# Patient Record
Sex: Female | Born: 1995 | Race: Black or African American | Hispanic: No | Marital: Single | Smoking: Former smoker
Health system: Southern US, Community
[De-identification: ages and names within clinical notes are randomized; demographics above are authoritative.]

## PROBLEM LIST (undated history)

## (undated) DIAGNOSIS — A6009 Herpesviral infection of other urogenital tract: Secondary | ICD-10-CM

## (undated) DIAGNOSIS — J302 Other seasonal allergic rhinitis: Secondary | ICD-10-CM

## (undated) DIAGNOSIS — J45909 Unspecified asthma, uncomplicated: Secondary | ICD-10-CM

## (undated) HISTORY — DX: Unspecified asthma, uncomplicated: J45.909

## (undated) HISTORY — DX: Other seasonal allergic rhinitis: J30.2

## (undated) HISTORY — DX: Herpesviral infection of other urogenital tract: A60.09

## (undated) HISTORY — PX: NO PAST SURGERIES: SHX2092

---
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
## 2014-01-06 ENCOUNTER — Ambulatory Visit (INDEPENDENT_AMBULATORY_CARE_PROVIDER_SITE_OTHER): Payer: 59 | Admitting: Family Medicine

## 2014-01-06 VITALS — BP 102/64 | HR 72 | Temp 98.1°F | Resp 16 | Ht 67.5 in | Wt 117.8 lb

## 2014-01-06 DIAGNOSIS — T7840XA Allergy, unspecified, initial encounter: Secondary | ICD-10-CM

## 2014-01-06 DIAGNOSIS — L500 Allergic urticaria: Secondary | ICD-10-CM

## 2014-01-06 MED ORDER — CETIRIZINE HCL 10 MG PO TABS: 10.0000 mg | ORAL_TABLET | Freq: Every day | ORAL | Status: DC

## 2014-01-06 MED ORDER — RANITIDINE HCL 150 MG PO TABS: 150.0000 mg | ORAL_TABLET | Freq: Two times a day (BID) | ORAL | Status: DC

## 2014-01-06 MED ORDER — HYDROXYZINE HCL 25 MG PO TABS: 12.5000 mg | ORAL_TABLET | Freq: Three times a day (TID) | ORAL | Status: DC | PRN

## 2014-01-06 NOTE — Progress Notes (Signed)
   Subjective:  This chart was scribed for Pamela Sorenson, MD by Bronson Curb, ED Scribe. This patient was seen in room Room/bed info 4 and the patient's care was started at 10:01 AM.   Patient ID: Pamela Sharp, female    DOB: 02-11-1996, 18 y.o.   MRN: [id number]   Chief Complaint  Patient presents with  . Rash    both arms; right leg; today     HPI  HPI Comments: Pamela Sharp is a 18 y.o. female who presents to the [facility] complaining of rash onset today. Patient states she was eating [employer] chicken when she started to break out in a rash over her bilateral arms, right leg, and back. Patient states she is certain it was [employer] since she has washed her sheets yesterday. There is associated bumps, itching, and redness. Patient reports she has "very sensitive" skin, and has used hydrocortisone cream with some improvement. Patient has history of seasonal allergies and takes Claritin daily    Past Medical History  Diagnosis Date  . Seasonal allergies    No current outpatient prescriptions on file prior to visit.   No current facility-administered medications on file prior to visit.   Allergies  Allergen Reactions  . Antihistamines, Chlorpheniramine-Type     Review of Systems  Constitutional: Negative for fever, appetite change and fatigue.  Skin: Positive for rash.      BP 102/64  Pulse 72  Temp(Src) 98.1 F (36.7 C) (Oral)  Resp 16  Ht 5' 7.5" (1.715 m)  Wt 117 lb 12.8 oz (53.434 kg)  BMI 18.17 kg/m2  SpO2 99%  Objective:   Physical Exam  Nursing note and vitals reviewed. Constitutional: She is oriented to person, place, and time. She appears well-developed and well-nourished. No distress.  HENT:  Head: Normocephalic and atraumatic.  Eyes: EOM are normal.  Neck: Neck supple. No tracheal deviation present.  Cardiovascular: Normal rate.   Pulmonary/Chest: Effort normal. No respiratory distress.  Musculoskeletal: Normal range of  motion.  Neurological: She is alert and oriented to person, place, and time.  Skin: Skin is warm and dry. Rash noted.  Numerous erythatmentous papules spread over extensor surface forearm. Some spread over lower back, upper and inner arms and wrists.  Psychiatric: She has a normal mood and affect. Her behavior is normal.          Assessment & Plan:   Allergic reaction  Allergic urticaria  Meds ordered this encounter  Medications  . cetirizine (ZYRTEC) 10 MG tablet    Sig: Take 1 tablet (10 mg total) by mouth daily.    Dispense:  30 tablet    Refill:  1  . hydrOXYzine (ATARAX/VISTARIL) 25 MG tablet    Sig: Take 0.5-1 tablets (12.5-25 mg total) by mouth every 8 (eight) hours as needed for itching.    Dispense:  30 tablet    Refill:  0  . ranitidine (ZANTAC) 150 MG tablet    Sig: Take 1 tablet (150 mg total) by mouth 2 (two) times daily.    Dispense:  60 tablet    Refill:  0    I personally performed the services described in this documentation, which was scribed in my presence. The recorded information has been reviewed and considered, and addended by me as needed.  Pamela Sorenson, MD MPH

## 2014-01-06 NOTE — Patient Instructions (Signed)
Take the cetirizine every night before bed and the ranitidine twice a day for 2 weeks. Use the hydroxyzine as needed for itching.  If the itching continues, apply ice.  Keep lotion or cortisone in the freezer to get soothing relief but we will not use a stronger topical cream due to poss side effects of thinning or lightening the skin. If it gets worse, please come back so we can see if you need a steroid shot.  Hives Hives are itchy, red, swollen areas of the skin. They can vary in size and location on your body. Hives can come and go for hours or several days (acute hives) or for several weeks (chronic hives). Hives do not spread from person to person (noncontagious). They may get worse with scratching, exercise, and emotional stress. CAUSES   Allergic reaction to food, additives, or drugs.  Infections, including the common cold.  Illness, such as vasculitis, lupus, or thyroid disease.  Exposure to sunlight, heat, or cold.  Exercise.  Stress.  Contact with chemicals. SYMPTOMS   Red or white swollen patches on the skin. The patches may change size, shape, and location quickly and repeatedly.  Itching.  Swelling of the hands, feet, and face. This may occur if hives develop deeper in the skin. DIAGNOSIS  Your caregiver can usually tell what is wrong by performing a physical exam. Skin or blood tests may also be done to determine the cause of your hives. In some cases, the cause cannot be determined. TREATMENT  Mild cases usually get better with medicines such as antihistamines. Severe cases may require an emergency epinephrine injection. If the cause of your hives is known, treatment includes avoiding that trigger.  HOME CARE INSTRUCTIONS   Avoid causes that trigger your hives.  Take antihistamines as directed by your caregiver to reduce the severity of your hives. Non-sedating or low-sedating antihistamines are usually recommended. Do not drive while taking an antihistamine.  Take  any other medicines prescribed for itching as directed by your caregiver.  Wear loose-fitting clothing.  Keep all follow-up appointments as directed by your caregiver. SEEK MEDICAL CARE IF:   You have persistent or severe itching that is not relieved with medicine.  You have painful or swollen joints. SEEK IMMEDIATE MEDICAL CARE IF:   You have a fever.  Your tongue or lips are swollen.  You have trouble breathing or swallowing.  You feel tightness in the throat or chest.  You have abdominal pain. These problems may be the first sign of a life-threatening allergic reaction. Call your local emergency services (911 in [place] MAKE SURE YOU:   Understand these instructions.  Will watch your condition.  Will get help right away if you are not doing well or get worse. Document Released: 07/29/2005 Document Revised: 01/28/2012 Document Reviewed: 10/22/2011 [employer] Patient Information 2014 [place], [place].

## 2014-01-30 ENCOUNTER — Ambulatory Visit (INDEPENDENT_AMBULATORY_CARE_PROVIDER_SITE_OTHER): Payer: 59 | Admitting: Emergency Medicine

## 2014-01-30 VITALS — BP 102/60 | HR 76 | Temp 97.8°F | Resp 20 | Ht 67.5 in | Wt 118.0 lb

## 2014-01-30 DIAGNOSIS — M26629 Arthralgia of temporomandibular joint, unspecified side: Secondary | ICD-10-CM

## 2014-01-30 MED ORDER — DIAZEPAM 2 MG PO TABS: 2.0000 mg | ORAL_TABLET | Freq: Four times a day (QID) | ORAL | Status: DC | PRN

## 2014-01-30 MED ORDER — NAPROXEN SODIUM 550 MG PO TABS: 550.0000 mg | ORAL_TABLET | Freq: Two times a day (BID) | ORAL | Status: DC

## 2014-01-30 NOTE — Progress Notes (Signed)
[facility] [address], [place] [id number] [phone]  Date:  01/30/2014   Name:  Pamela Sharp   DOB:  09/30/1995   MRN:  [id number]  PCP:  No PCP Per Patient    Chief Complaint: Jaw Pain   History of Present Illness:  Pamela Sharp is a 18 y.o. very pleasant female patient who presents with the following:  Awoke with pain in right jaw yesterday.  Increases when attempts to open mouth, talk or eat.  No history of injury or cavities.  No cough or coryza, fever or chills, nausea or vomiting.  No stool change or rash.  Some thermal sensitivity to right maxillary and mandibular molars for extended period.  No improvement with over the counter medications or other home remedies. Denies other complaint or health concern today.   There are no active problems to display for this patient.   Past Medical History  Diagnosis Date  . Seasonal allergies     No past surgical history on file.  History  Substance Use Topics  . Smoking status: Former Games developermoker  . Smokeless tobacco: Not on file     Comment: did not smoke daily---social smoking  . Alcohol Use: No    No family history on file.  Allergies  Allergen Reactions  . Antihistamines, Chlorpheniramine-Type     Medication list has been reviewed and updated.  Current Outpatient Prescriptions on File Prior to Visit  Medication Sig Dispense Refill  . hydrOXYzine (ATARAX/VISTARIL) 25 MG tablet Take 0.5-1 tablets (12.5-25 mg total) by mouth every 8 (eight) hours as needed for itching.  30 tablet  0  . ranitidine (ZANTAC) 150 MG tablet Take 1 tablet (150 mg total) by mouth 2 (two) times daily.  60 tablet  0   No current facility-administered medications on file prior to visit.    Review of Systems:  As per HPI, otherwise negative.    Physical Examination: Filed Vitals:   01/30/14 1314  BP: 102/60  Pulse: 76  Temp: 97.8 F (36.6 C)  Resp: 20   Filed Vitals:   01/30/14 1314  Height: 5'  7.5" (1.715 m)  Weight: 118 lb (53.524 kg)   Body mass index is 18.2 kg/(m^2). Ideal Body Weight: Weight in (lb) to have BMI = 25: 161.7   GEN: WDWN, NAD, Non-toxic, Alert & Oriented x 3 HEENT: Atraumatic, Normocephalic.  Tender right TMJ.  Erupting wisdom teeth.  No sialoadenitis Ears and Nose: No external deformity. EXTR: No clubbing/cyanosis/edema NEURO: Normal gait.  PSYCH: Normally interactive. Conversant. Not depressed or anxious appearing.  Calm demeanor.    Assessment and Plan: TMJ Valium Anaprox   Signed,  Phillips OdorJeffery Osinachi Navarrette, MD

## 2014-01-30 NOTE — Patient Instructions (Signed)
Temporomandibular Problems  Temporomandibular joint (TMJ) dysfunction means there are problems with the joint between your jaw and your skull. This is a joint lined by cartilage like other joints in your body but also has a small disc in the joint which keeps the bones from rubbing on each other. These joints are like other joints and can get inflamed (sore) from arthritis and other problems. When this joint gets sore, it can cause headaches and pain in the jaw and the face. CAUSES  Usually the arthritic types of problems are caused by soreness in the joint. Soreness in the joint can also be caused by overuse. This may come from grinding your teeth. It may also come from mis-alignment in the joint. DIAGNOSIS Diagnosis of this condition can often be made by history and exam. Sometimes your caregiver may need X-rays or an MRI scan to determine the exact cause. It may be necessary to see your dentist to determine if your teeth and jaws are lined up correctly. TREATMENT  Most of the time this problem is not serious; however, sometimes it can persist (become chronic). When this happens medications that will cut down on inflammation (soreness) help. Sometimes a shot of cortisone into the joint will be helpful. If your teeth are not aligned it may help for your dentist to make a splint for your mouth that can help this problem. If no physical problems can be found, the problem may come from tension. If tension is found to be the cause, biofeedback or relaxation techniques may be helpful. HOME CARE INSTRUCTIONS   Later in the day, applications of ice packs may be helpful. Ice can be used in a plastic bag with a towel around it to prevent frostbite to skin. This may be used about every 2 hours for 20 to 30 minutes, as needed while awake, or as directed by your caregiver.  Only take over-the-counter or prescription medicines for pain, discomfort, or fever as directed by your caregiver.  If physical therapy was  prescribed, follow your caregiver's directions.  Wear mouth appliances as directed if they were given. Document Released: 04/23/2001 Document Revised: 10/21/2011 Document Reviewed: 07/31/2008 [employer] Patient Information 2015 [employer]. This information is not intended to replace advice given to you by your health care provider. Make sure you discuss any questions you have with your health care provider.  

## 2014-01-31 ENCOUNTER — Telehealth: Payer: Self-pay

## 2014-01-31 NOTE — Telephone Encounter (Signed)
Pt's father Dorene SorrowJerry called in again to see if his daughter can have a different medication instead of Valium.Please call @ [phone].Thank you

## 2014-01-31 NOTE — Telephone Encounter (Signed)
After speaking with Kasandra KnudsenSara H, called patient to check up on her and see what her status is.  LMVM for pt to CB.

## 2014-01-31 NOTE — Telephone Encounter (Signed)
JERRY STATES HIS DAUGHTER WAS GIVEN VALIUM AND HE WOULD LIKE TO HAVE IT CHANGED, HE DOESN'T WANT HER TO GET ADDICTIVE. PLEASE CALL [phone]    [facility] [address]

## 2014-02-01 NOTE — Telephone Encounter (Signed)
Pt called to say meds are too strong,and would like a weaker dose or med  Best phone for pt is [phone]  Pharmacy [facility] [address]

## 2014-02-01 NOTE — Telephone Encounter (Signed)
LMVM to CB. 

## 2014-02-01 NOTE — Telephone Encounter (Signed)
Please let patient know that the valium is the lowest dose possible and is used for a muscle relaxant and should not have any noticeable effects systemically. There is NO chance of her becoming addicted to the short course and low dose she was prescribed.

## 2014-02-01 NOTE — Telephone Encounter (Signed)
Spoke to patients' father and advised that he have the patient call back as she is 18 years old and there is no hippa form on file.  He said he would do so.

## 2014-02-02 ENCOUNTER — Telehealth: Payer: Self-pay

## 2014-02-02 NOTE — Telephone Encounter (Signed)
Patient is inquiring about her rx. Wants to know if we've received the request to change the dosage as she feels it is too strong for her. Please return call and advise. Thank you.

## 2014-02-02 NOTE — Telephone Encounter (Signed)
Spoke to father- Dorene SorrowJerry is on HIPPA. Advised pt Dr. Ewell PoeAnderson's message from previous phone message. Father understands.

## 2014-02-03 NOTE — Telephone Encounter (Signed)
Called pt.  Father answered.  Told him we were returning patient's call.  He said someone had already called them yesterday and that they had worked everything out.

## 2014-04-12 ENCOUNTER — Ambulatory Visit (INDEPENDENT_AMBULATORY_CARE_PROVIDER_SITE_OTHER): Payer: 59 | Admitting: Family Medicine

## 2014-04-12 ENCOUNTER — Ambulatory Visit (INDEPENDENT_AMBULATORY_CARE_PROVIDER_SITE_OTHER): Payer: 59

## 2014-04-12 VITALS — BP 122/70 | HR 75 | Temp 98.5°F | Resp 16 | Ht 67.0 in | Wt 115.0 lb

## 2014-04-12 DIAGNOSIS — T148XXA Other injury of unspecified body region, initial encounter: Secondary | ICD-10-CM

## 2014-04-12 DIAGNOSIS — M79641 Pain in right hand: Secondary | ICD-10-CM

## 2014-04-12 DIAGNOSIS — M79609 Pain in unspecified limb: Secondary | ICD-10-CM

## 2014-04-12 MED ORDER — IBUPROFEN 600 MG PO TABS: 600.0000 mg | ORAL_TABLET | Freq: Three times a day (TID) | ORAL | Status: DC | PRN

## 2014-04-12 MED ORDER — IBUPROFEN 200 MG PO TABS: 500.0000 mg | ORAL_TABLET | Freq: Once | ORAL | Status: DC

## 2014-04-12 NOTE — Patient Instructions (Signed)
Wrist Sprain  with Rehab  A sprain is an injury in which a ligament that maintains the proper alignment of a joint is partially or completely torn. The ligaments of the wrist are susceptible to sprains. Sprains are classified into three categories. Grade 1 sprains cause pain, but the tendon is not lengthened. Grade 2 sprains include a lengthened ligament because the ligament is stretched or partially ruptured. With grade 2 sprains there is still function, although the function may be diminished. Grade 3 sprains are characterized by a complete tear of the tendon or muscle, and function is usually impaired.  SYMPTOMS   · Pain tenderness, inflammation, and/or bruising (contusion) of the injury.  · A "pop" or tear felt and/or heard at the time of injury.  · Decreased wrist function.  CAUSES   A wrist sprain occurs when a force is placed on one or more ligaments that is greater than it/they can withstand. Common mechanisms of injury include:  · Catching a ball with you hands.  · Repetitive and/ or strenuous extension or flexion of the wrist.  RISK INCREASES WITH:  · Previous wrist injury.  · Contact sports (boxing or wrestling).  · Activities in which falling is common.  · Poor strength and flexibility.  · Improperly fitted or padded protective equipment.  PREVENTION  · Warm up and stretch properly before activity.  · Allow for adequate recovery between workouts.  · Maintain physical fitness:  ¨ Strength, flexibility, and endurance.  ¨ Cardiovascular fitness.  · Protect the wrist joint by limiting its motion with the use of taping, braces, or splints.  · Protect the wrist after injury for 6 to 12 months.  PROGNOSIS   The prognosis for wrist sprains depends on the degree of injury. Grade 1 sprains require 2 to 6 weeks of treatment. Grade 2 sprains require 6 to 8 weeks of treatment, and grade 3 sprains require up to 12 weeks.   RELATED COMPLICATIONS   · Prolonged healing time, if improperly treated or  re-injured.  · Recurrent symptoms that result in a chronic problem.  · Injury to nearby structures (bone, cartilage, nerves, or tendons).  · Arthritis of the wrist.  · Inability to compete in athletics at a high level.  · Wrist stiffness or weakness.  · Progression to a complete rupture of the ligament.  TREATMENT   Treatment initially involves resting from any activities that aggravate the symptoms, and the use of ice and medications to help reduce pain and inflammation. Your caregiver may recommend immobilizing the wrist for a period of time in order to reduce stress on the ligament and allow for healing. After immobilization it is important to perform strengthening and stretching exercises to help regain strength and a full range of motion. These exercises may be completed at home or with a therapist. Surgery is not usually required for wrist sprains, unless the ligament has been ruptured (grade 3 sprain).  MEDICATION   · If pain medication is necessary, then nonsteroidal anti-inflammatory medications, such as aspirin and ibuprofen, or other minor pain relievers, such as acetaminophen, are often recommended.  · Do not take pain medication for 7 days before surgery.  · Prescription pain relievers may be given if deemed necessary by your caregiver. Use only as directed and only as much as you need.  HEAT AND COLD  · Cold treatment (icing) relieves pain and reduces inflammation. Cold treatment should be applied for 10 to 15 minutes every 2 to 3 hours for inflammation   and pain and immediately after any activity that aggravates your symptoms. Use ice packs or massage the area with a piece of ice (ice massage).  · Heat treatment may be used prior to performing the stretching and strengthening activities prescribed by your caregiver, physical therapist, or athletic trainer. Use a heat pack or soak your injury in warm water.  SEEK MEDICAL CARE IF:  · Treatment seems to offer no benefit, or the condition worsens.  · Any  medications produce adverse side effects.  EXERCISES  RANGE OF MOTION (ROM) AND STRETCHING EXERCISES - Wrist Sprain   These exercises may help you when beginning to rehabilitate your injury. Your symptoms may resolve with or without further involvement from your physician, physical therapist or athletic trainer. While completing these exercises, remember:   · Restoring tissue flexibility helps normal motion to return to the joints. This allows healthier, less painful movement and activity.  · An effective stretch should be held for at least 30 seconds.  · A stretch should never be painful. You should only feel a gentle lengthening or release in the stretched tissue.  RANGE OF MOTION - Wrist Flexion, Active-Assisted  · Extend your right / left elbow with your fingers pointing down.*  · Gently pull the back of your hand towards you until you feel a gentle stretch on the top of your forearm.  · Hold this position for __________ seconds.  Repeat __________ times. Complete this exercise __________ times per day.   *If directed by your physician, physical therapist or athletic trainer, complete this stretch with your elbow bent rather than extended.  RANGE OF MOTION - Wrist Extension, Active-Assisted  · Extend your right / left elbow and turn your palm upwards.*  · Gently pull your palm/fingertips back so your wrist extends and your fingers point more toward the ground.  · You should feel a gentle stretch on the inside of your forearm.  · Hold this position for __________ seconds.  Repeat __________ times. Complete this exercise __________ times per day.  *If directed by your physician, physical therapist or athletic trainer, complete this stretch with your elbow bent, rather than extended.  RANGE OF MOTION - Supination, Active  · Stand or sit with your elbows at your side. Bend your right / left elbow to 90 degrees.  · Turn your palm upward until you feel a gentle stretch on the inside of your forearm.  · Hold this  position for __________ seconds. Slowly release and return to the starting position.  Repeat __________ times. Complete this stretch __________ times per day.   RANGE OF MOTION - Pronation, Active  · Stand or sit with your elbows at your side. Bend your right / left elbow to 90 degrees.  · Turn your palm downward until you feel a gentle stretch on the top of your forearm.  · Hold this position for __________ seconds. Slowly release and return to the starting position.  Repeat __________ times. Complete this stretch __________ times per day.   STRETCH - Wrist Flexion  · Place the back of your right / left hand on a tabletop leaving your elbow slightly bent. Your fingers should point away from your body.  · Gently press the back of your hand down onto the table by straightening your elbow. You should feel a stretch on the top of your forearm.  · Hold this position for __________ seconds.  Repeat __________ times. Complete this stretch __________ times per day.   STRETCH - Wrist   Extension  · Place your right / left fingertips on a tabletop leaving your elbow slightly bent. Your fingers should point backwards.  · Gently press your fingers and palm down onto the table by straightening your elbow. You should feel a stretch on the inside of your forearm.  · Hold this position for __________ seconds.  Repeat __________ times. Complete this stretch __________ times per day.   STRENGTHENING EXERCISES - Wrist Sprain  These exercises may help you when beginning to rehabilitate your injury. They may resolve your symptoms with or without further involvement from your physician, physical therapist or athletic trainer. While completing these exercises, remember:   · Muscles can gain both the endurance and the strength needed for everyday activities through controlled exercises.  · Complete these exercises as instructed by your physician, physical therapist or athletic trainer. Progress with the resistance and repetition exercises  only as your caregiver advises.  STRENGTH - Wrist Flexors  · Sit with your right / left forearm palm-up and fully supported. Your elbow should be resting below the height of your shoulder. Allow your wrist to extend over the edge of the surface.  · Loosely holding a __________ weight or a piece of rubber exercise band/tubing, slowly curl your hand up toward your forearm.  · Hold this position for __________ seconds. Slowly lower the wrist back to the starting position in a controlled manner.  Repeat __________ times. Complete this exercise __________ times per day.   STRENGTH - Wrist Extensors  · Sit with your right / left forearm palm-down and fully supported. Your elbow should be resting below the height of your shoulder. Allow your wrist to extend over the edge of the surface.  · Loosely holding a __________ weight or a piece of rubber exercise band/tubing, slowly curl your hand up toward your forearm.  · Hold this position for __________ seconds. Slowly lower the wrist back to the starting position in a controlled manner.  Repeat __________ times. Complete this exercise __________ times per day.   STRENGTH - Ulnar Deviators  · Stand with a ____________________ weight in your right / left hand, or sit holding on to the rubber exercise band/tubing with your opposite arm supported.  · Move your wrist so that your pinkie travels toward your forearm and your thumb moves away from your forearm.  · Hold this position for __________ seconds and then slowly lower the wrist back to the starting position.  Repeat __________ times. Complete this exercise __________ times per day  STRENGTH - Radial Deviators  · Stand with a ____________________ weight in your  · right / left hand, or sit holding on to the rubber exercise band/tubing with your arm supported.  · Raise your hand upward in front of you or pull up on the rubber tubing.  · Hold this position for __________ seconds and then slowly lower the wrist back to the  starting position.  Repeat __________ times. Complete this exercise __________ times per day.  STRENGTH - Forearm Supinators  · Sit with your right / left forearm supported on a table, keeping your elbow below shoulder height. Rest your hand over the edge, palm down.  · Gently grip a hammer or a soup ladle.  · Without moving your elbow, slowly turn your palm and hand upward to a "thumbs-up" position.  · Hold this position for __________ seconds. Slowly return to the starting position.  Repeat __________ times. Complete this exercise __________ times per day.   STRENGTH - Forearm   Pronators  · Sit with your right / left forearm supported on a table, keeping your elbow below shoulder height. Rest your hand over the edge, palm up.  · Gently grip a hammer or a soup ladle.  · Without moving your elbow, slowly turn your palm and hand upward to a "thumbs-up" position.  · Hold this position for __________ seconds. Slowly return to the starting position.  Repeat __________ times. Complete this exercise __________ times per day.   STRENGTH - Grip  · Grasp a tennis ball, a dense sponge, or a large, rolled sock in your hand.  · Squeeze as hard as you can without increasing any pain.  · Hold this position for __________ seconds. Release your grip slowly.  Repeat __________ times. Complete this exercise __________ times per day.   Document Released: 07/29/2005 Document Revised: 10/21/2011 Document Reviewed: 11/10/2008  [employer]® Patient Information ©2015 [employer]. This information is not intended to replace advice given to you by your health care provider. Make sure you discuss any questions you have with your health care provider.

## 2014-04-12 NOTE — Progress Notes (Signed)
   Subjective:    Patient ID: Pamela Sharp, female    DOB: [birth year], 18 y.o.   MRN: [id number]  HPI Pamela Sharp is an 17yo right-hand dominant female who presents with right hand pain after it got slammed in her car door. Onset of symptoms was this afternoon. She denies any abuse or feeling unsafe. Location of pain is right lateral hand/thumb area of the hand. Notes associated swelling. Pain is aggravated with movement. She has not found any relieving factors. Has not yet taken any medications. Notes associated numbness at the distal thumb. She denies any associated weakness of the hand, wrist, or thumb.  Past Medical History  Diagnosis Date  . Seasonal allergies    History   Social History  . Marital Status: Single    Spouse Name: N/A    Number of Children: N/A  . Years of Education: N/A   Occupational History  . Not on file.   Social History Main Topics  . Smoking status: Former Games developer  . Smokeless tobacco: Not on file     Comment: did not smoke daily---social smoking  . Alcohol Use: No  . Drug Use: No  . Sexual Activity: Not on file   Other Topics Concern  . Not on file   Social History Narrative  . No narrative on file    Review of Systems As per history of present illness. 11 point review of systems was performed is otherwise negative.    Objective:   Physical Exam BP 122/70  Pulse 75  Temp(Src) 98.5 F (36.9 C)  Resp 16  Ht  (1.702 m)  Wt 115 lb (52.164 kg)  BMI 18.01 kg/m2  SpO2 100% GEN: The patient is well-developed well-nourished female and in mild distress.  She is awake alert and oriented x3. SKIN: Skin is intact. warm and well-perfused, no rash  Neuro: Strength 5/5 globally. Sensation intact throughout, with hyperesthesia of the right thumb and radial side of the wrist. DTRs 2/4 bilaterally at the biceps, triceps, and brachioradialis. No focal deficits. Vasc: +2 bilateral distal pulses at radial and ulnar pulses. MSK:  Examination of the  right wrist reveals swelling over the thenar eminence and right thumb. Movement is limited due to pain in the right wrist and thumb. She has no overlying ecchymoses or bruising.  Resisted finger and thumb flexion and  extension with intact strength of the thumb, fingers, and wrist. She has tenderness over the right wrist diffusely, including the right snuffbox. She has full pain-free range of motion at the elbow, shoulder, and neck.  Radiographs were obtained of the right wrist and hand: [facility] Reading by Dr. Joellyn Haff (Primary): She is good bony alignment at the wrist and hand. There is a question of a cortical defect at the right distal radius in the region of her growth plate. Question if could be acute fracture versus closing growth plate. Overread by radiologist confirms no acute bony abnormality.      Assessment & Plan:  1. right wrist and hand injury, closed contusion -She was given 500 mg of ibuprofen while here. -X-rays were read and overread, which were negative for fracture -Rest, ice, elevate, compression -Thumb spica wrist splint provided today -ROM exercises discussed and given -Follow-up if sx acutely worsen, including numbness, increasing pain, or swelling.  Discussed with Dr. Conley Rolls. I agree with A/P above. Dr Tye Savoy  04/12/2014  Dr. Joellyn Haff, DO Sports Medicine Fellow

## 2014-04-14 ENCOUNTER — Telehealth: Payer: Self-pay

## 2014-04-14 NOTE — Telephone Encounter (Signed)
Patient wants to know if she can have a stronger rx for ibuprofen. Please return call and advise. CB # [phone]

## 2014-04-15 ENCOUNTER — Encounter: Payer: Self-pay | Admitting: Family Medicine

## 2014-04-15 NOTE — Telephone Encounter (Signed)
Pt called in and states she needs two separate notes for work and for school for her visit on 04/12/14. The notes need to specify her mobility restrictions if any. She will come in around 2 or 3 today to pick them up. Thank you

## 2014-04-15 NOTE — Telephone Encounter (Signed)
Up front for her to p/u

## 2014-04-20 ENCOUNTER — Telehealth: Payer: Self-pay

## 2014-04-20 NOTE — Telephone Encounter (Signed)
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   How much can she lift?  What are her restrictions.   [phone]

## 2014-04-20 NOTE — Telephone Encounter (Signed)
Reprinted note for father to pick up- in drawer

## 2015-06-23 ENCOUNTER — Ambulatory Visit (HOSPITAL_BASED_OUTPATIENT_CLINIC_OR_DEPARTMENT_OTHER)
Admission: RE | Admit: 2015-06-23 | Discharge: 2015-06-23 | Disposition: A | Discharge status: Home / Self Care | Payer: 59 | Source: Ambulatory Visit | Attending: Family Medicine | Admitting: Family Medicine

## 2015-06-23 ENCOUNTER — Ambulatory Visit (INDEPENDENT_AMBULATORY_CARE_PROVIDER_SITE_OTHER): Payer: 59 | Admitting: Family Medicine

## 2015-06-23 VITALS — BP 120/62 | HR 76 | Temp 98.1°F | Resp 16 | Ht 67.0 in | Wt 108.0 lb

## 2015-06-23 DIAGNOSIS — R1031 Right lower quadrant pain: Secondary | ICD-10-CM | POA: Insufficient documentation

## 2015-06-23 DIAGNOSIS — N854 Malposition of uterus: Secondary | ICD-10-CM | POA: Insufficient documentation

## 2015-06-23 DIAGNOSIS — A749 Chlamydial infection, unspecified: Secondary | ICD-10-CM

## 2015-06-23 LAB — POCT URINALYSIS DIP (MANUAL ENTRY)
BILIRUBIN UA: NEGATIVE
Bilirubin, UA: NEGATIVE
Blood, UA: NEGATIVE
Glucose, UA: NEGATIVE
Nitrite, UA: NEGATIVE
Spec Grav, UA: 1.02
UROBILINOGEN UA: 1
pH, UA: 7

## 2015-06-23 LAB — POCT URINE PREGNANCY: PREG TEST UR: NEGATIVE

## 2015-06-23 LAB — POCT CBC
Granulocyte percent: 54.5 %G (ref 37–80)
HCT, POC: 39.4 % (ref 37.7–47.9)
Hemoglobin: 13.8 g/dL (ref 12.2–16.2)
LYMPH, POC: 2 (ref 0.6–3.4)
MCH, POC: 31.7 pg — AB (ref 27–31.2)
MCHC: 35 g/dL (ref 31.8–35.4)
MCV: 90.5 fL (ref 80–97)
MID (cbc): 1 — AB (ref 0–0.9)
MPV: 8.3 fL (ref 0–99.8)
PLATELET COUNT, POC: 250 10*3/uL (ref 142–424)
POC Granulocyte: 3.6 (ref 2–6.9)
POC LYMPH %: 30.1 % (ref 10–50)
POC MID %: 15.4 %M — AB (ref 0–12)
RBC: 4.35 M/uL (ref 4.04–5.48)
RDW, POC: 13.3 %
WBC: 6.6 10*3/uL (ref 4.6–10.2)

## 2015-06-23 LAB — POC MICROSCOPIC URINALYSIS (UMFC): MUCUS RE: ABSENT

## 2015-06-23 MED ORDER — NITROFURANTOIN MONOHYD MACRO 100 MG PO CAPS: 100.0000 mg | ORAL_CAPSULE | Freq: Two times a day (BID) | ORAL | Status: DC

## 2015-06-23 NOTE — Patient Instructions (Addendum)
You are scheduled at 4:30 pm today for outpatient U/S's. You will need to arrive at [facility] by 4:30 pm or a little before to register.  To register you will go in at the main entrance and then go to radiology to register.    [facility]  [address] [place] [id number]  I think that you may have an ovarian cyst.   We will send you for an ultrasound today to check your ovaries.  We will also try to evaluate your appendix We will also treat you with macrobid for a possible UTI- we will await your urine culture  I will give you a call when your ultrasound results come in- you can go home after your scan Continue taking your birth control pill

## 2015-06-23 NOTE — Progress Notes (Addendum)
[facility] [address], [place] [id number] [phone]  Date:  06/23/2015   Name:  Pamela Sharp   DOB:  11/26/1995   MRN:  [id number]  PCP:  No PCP Per Patient    Chief Complaint: Flank Pain and Fatigue   History of Present Illness:  Pamela Sharp is a 19 y.o. very pleasant female patient who presents with the following:  She is here today with concern of right flank pain.  Wonders if she might have a kidney stone- she has never had on the past.   She had been getting depo shots and had her last shot approx in April of this year.  After she stopped the shots she had persistent bleeding.  Her GYN MD started her on OCP- she started this just last week. This did stop her bleeding.  However she now notes a pain in her right lower quadrant which has been present for 5 days or so.    She is not bleeding now.   She notes a "weird feeling" when she urinates.  She is having urinary frequency.   No blood in her urine  No fever or vomiting, but she can feel naustaed.  She is still able to do her regular activities She has not felt much like eating recently but is still able to eat  She is generally in good health   There are no active problems to display for this patient.   Past Medical History  Diagnosis Date  . Seasonal allergies     No past surgical history on file.  Social History  Substance Use Topics  . Smoking status: Current Some Day Smoker  . Smokeless tobacco: None     Comment: did not smoke daily---social smoking  . Alcohol Use: No    No family history on file.  Allergies  Allergen Reactions  . Antihistamines, Chlorpheniramine-Type     Medication list has been reviewed and updated.  Current Outpatient Prescriptions on File Prior to Visit  Medication Sig Dispense Refill  . ranitidine (ZANTAC) 150 MG tablet Take 1 tablet (150 mg total) by mouth 2 (two) times daily. 60 tablet 0   Current Facility-Administered Medications on  File Prior to Visit  Medication Dose Route Frequency Provider Last Rate Last Dose  . ibuprofen (ADVIL,MOTRIN) tablet 500 mg  500 mg Oral Once Ethelda ChickKristi M Smith, MD        Review of Systems:  As per HPI- otherwise negative.   Physical Examination: Filed Vitals:   06/23/15 1002  BP: 120/62  Pulse: 76  Temp: 98.1 F (36.7 C)  Resp: 16   Filed Vitals:   06/23/15 1002  Height: 5\' 7"  (1.702 m)  Weight: 108 lb (48.988 kg)   Body mass index is 16.91 kg/(m^2). Ideal Body Weight: Weight in (lb) to have BMI = 25: 159.3  GEN: WDWN, NAD, Non-toxic, A & O x 3, thin build, looks well HEENT: Atraumatic, Normocephalic. Neck supple. No masses, No LAD. Ears and Nose: No external deformity. CV: RRR, No M/G/R. No JVD. No thrill. No extra heart sounds. PULM: CTA B, no wheezes, crackles, rhonchi. No retractions. No resp. distress. No accessory muscle use. ABD: S, ND, +BS. No rebound. No HSM. She notes mild tenderness over the bilateral lower abdomen  EXTR: No c/c/e NEURO Normal gait.  PSYCH: Normally interactive. Conversant. Not depressed or anxious appearing.  Calm demeanor.  Gu: no CMT, no adnexal masses palpated, uterus normal, no external lesions  Results for orders placed or performed in visit on 06/23/15  POCT CBC  Result Value Ref Range   WBC 6.6 4.6 - 10.2 K/uL   Lymph, poc 2.0 0.6 - 3.4   POC LYMPH PERCENT 30.1 10 - 50 %L   MID (cbc) 1.0 (A) 0 - 0.9   POC MID % 15.4 (A) 0 - 12 %M   POC Granulocyte 3.6 2 - 6.9   Granulocyte percent 54.5 37 - 80 %G   RBC 4.35 4.04 - 5.48 M/uL   Hemoglobin 13.8 12.2 - 16.2 g/dL   HCT, POC 16.1 09.6 - 47.9 %   MCV 90.5 80 - 97 fL   MCH, POC 31.7 (A) 27 - 31.2 pg   MCHC 35.0 31.8 - 35.4 g/dL   RDW, POC 04.5 %   Platelet Count, POC 250 142 - 424 K/uL   MPV 8.3 0 - 99.8 fL  POCT urine pregnancy  Result Value Ref Range   Preg Test, Ur Negative Negative  POCT urinalysis dipstick  Result Value Ref Range   Color, UA yellow yellow   Clarity, UA  cloudy (A) clear   Glucose, UA negative negative   Bilirubin, UA negative negative   Ketones, POC UA negative negative   Spec Grav, UA 1.020    Blood, UA negative negative   pH, UA 7.0    Protein Ur, POC =30 (A) negative   Urobilinogen, UA 1.0    Nitrite, UA Negative Negative   Leukocytes, UA Trace (A) Negative  POCT Microscopic Urinalysis ([facility])  Result Value Ref Range   WBC,UR,HPF,POC Moderate (A) None WBC/hpf   RBC,UR,HPF,POC None None RBC/hpf   Bacteria Few (A) None, Too numerous to count   Mucus Absent Absent   Epithelial Cells, UR Per Microscopy Few (A) None, Too numerous to count cells/hpf    Assessment and Plan: RLQ abdominal pain - Plan: POCT CBC, POCT urine pregnancy, POCT urinalysis dipstick, POCT Microscopic Urinalysis ([facility]), Urine culture, GC/Chlamydia Probe Amp, nitrofurantoin, macrocrystal-monohydrate, (MACROBID) 100 MG capsule, US Pelvis Complete, [place] Art/Ven Flow Abd Pelv Doppler, US Transvaginal Non-OB, US Abdomen Limited, CANCELED: US Pelvis Complete, CANCELED: US Abdomen Limited, CANCELED: US Abdomen Limited, CANCELED: US Abdomen Complete  Discussed in detail with pt and her father.  Suspect that she may have an ovarian cyst causing her pain. Also consider UTI.  Appendicitis is less likely given her normal wbc count, lack or anorexia/ tachycardia/ fever, and presence of pain for 5 days without worsening.  However this is something to consider- offered CT scan for appendicitis vs ultrasound which is better for looking at ovaries.  We can try to see the appendix on [place] but this is not always successful. After discussion of relative merits and risks of both modalities pt decided to pursue an [place] today  Sent for [place]- results as below.  [place] tech called to let me know that pt had no pain during exam.  Called pt to discuss.  There is no ovarian cyst.  Appendix could not be visualized.  At this time she has mild pain and is feeling quite hungry- she is heading to eat.  She feels  comfortable observing her sx overnight but will come back in if not improving tomorrow.  To ER if worse tonight Will treat for possible UTI with macrobid  Meds ordered this encounter  Medications  . norgestimate-ethinyl estradiol (ORTHO-CYCLEN,SPRINTEC,PREVIFEM) 0.25-35 MG-MCG tablet    Sig: Take 1 tablet by mouth daily.  . nitrofurantoin, macrocrystal-monohydrate, (MACROBID) 100 MG  capsule    Sig: Take 1 capsule (100 mg total) by mouth 2 (two) times daily.    Dispense:  14 capsule    Refill:  0   Called on 11/14 to check on her- no answer but [facility] Called 11/15 to report urine culture.  She does have a UTI sensitive to macrobid.  LMOM again  TRANSABDOMINAL AND TRANSVAGINAL ULTRASOUND OF PELVIS  DOPPLER ULTRASOUND OF OVARIES  TECHNIQUE: Both transabdominal and transvaginal ultrasound examinations of the pelvis were performed. Transabdominal technique was performed for global imaging of the pelvis including uterus, ovaries, adnexal regions, and pelvic cul-de-sac.  It was necessary to proceed with endovaginal exam following the transabdominal exam to visualize the endometrium, ovaries and adnexa. Color and duplex Doppler ultrasound was utilized to evaluate blood flow to the ovaries.  COMPARISON: None.  FINDINGS: Uterus  Measurements: 7.9 x 3.2 x 3.8 cm. The anteverted anteflexed uterus appears normal in size and configuration, with no uterine fibroids or other myometrial abnormality.  Endometrium  Thickness: 7 mm. No focal abnormality visualized.  Right ovary  Measurements: 3.5 x 2.7 x 2.0 cm. Normal appearance/no adnexal mass. There is a dominant 2.3 cm simple right ovarian follicle.  Left ovary  Measurements: 3.0 x 0.9 x 1.2 cm. Normal appearance/no adnexal mass.  Pulsed Doppler evaluation of both ovaries demonstrates normal low-resistance arterial and venous waveforms.  Other findings  No abnormal free fluid in the pelvis.  IMPRESSION: 1. No evidence of  adnexal torsion. No suspicious ovarian or adnexal findings. Dominant right ovarian 2.3 cm follicle. 2. Normal anteverted uterus.  LIMITED ABDOMINAL ULTRASOUND  TECHNIQUE: Wallace Cullens scale imaging of the right lower quadrant was performed to evaluate for suspected appendicitis. Standard imaging planes and graded compression technique were utilized.  COMPARISON: None.  FINDINGS: The appendix is not visualized.  Ancillary findings: Considerable bowel gas in the right lower quadrant.  Factors affecting image quality: Bowel gas limits visualization of structures.  IMPRESSION: The appendix could not be visualized. If there is strong clinical concern of acute appendicitis, abdominal and pelvic CT scanning would be the most useful next imaging step.   Signed Abbe Amsterdam, MD  Called her on 11/15- she does also have chlamydia.  Discussed with her, rx for azithromycin sent to her drug store.  Advised her to not have sex for at least 10 days, and that partners will need to be notified.  She states understanding  Meds ordered this encounter  Medications  . norgestimate-ethinyl estradiol (ORTHO-CYCLEN,SPRINTEC,PREVIFEM) 0.25-35 MG-MCG tablet    Sig: Take 1 tablet by mouth daily.  . nitrofurantoin, macrocrystal-monohydrate, (MACROBID) 100 MG capsule    Sig: Take 1 capsule (100 mg total) by mouth 2 (two) times daily.    Dispense:  14 capsule    Refill:  0  . azithromycin (ZITHROMAX) 250 MG tablet    Sig: Take 4 pills once    Dispense:  4 tablet    Refill:  0

## 2015-06-26 ENCOUNTER — Encounter: Payer: Self-pay | Admitting: Family Medicine

## 2015-06-26 LAB — URINE CULTURE: Colony Count: >=100000

## 2015-06-27 LAB — GC/CHLAMYDIA PROBE AMP
CT PROBE, AMP APTIMA: POSITIVE — AB
GC Probe RNA: NEGATIVE

## 2015-06-27 MED ORDER — AZITHROMYCIN 250 MG PO TABS: ORAL_TABLET | ORAL | Status: DC

## 2015-06-27 NOTE — Addendum Note (Signed)
Addended by: Abbe AmsterdamOPLAND, Teneka Malmberg C on: 06/27/2015 05:32 PM   Modules accepted: Orders

## 2015-09-16 ENCOUNTER — Ambulatory Visit (INDEPENDENT_AMBULATORY_CARE_PROVIDER_SITE_OTHER): Payer: 59 | Admitting: Family Medicine

## 2015-09-16 VITALS — BP 100/60 | HR 102 | Temp 98.4°F | Resp 24 | Ht 67.0 in | Wt 112.5 lb

## 2015-09-16 DIAGNOSIS — S60111A Contusion of right thumb with damage to nail, initial encounter: Secondary | ICD-10-CM

## 2015-09-16 NOTE — Progress Notes (Signed)
Patient ID: GRACIOUS RENKEN, female    DOB: 1995/12/02  Age: 20 y.o. MRN: [id number]  Chief Complaint  Patient presents with  . Thumb Injury    RIght Thumb-Closed in Car Door on Tuesday    Subjective:   20 year old lady who closed the car door on her right thumb 4 days ago. It has continued to be black and blue and blood under the nail. Her father insisted she come on and get it looked at. She still has a numbness sensation in the tip of the palmar surface of the thumb.  Otherwise she is healthy. She works in a [employer].  Current allergies, medications, problem list, past/family and social histories reviewed.  Objective:  BP 100/60 mmHg  Pulse 102  Temp(Src) 98.4 F (36.9 C) (Oral)  Resp 24  Ht  (1.702 m)  Wt 112 lb 8 oz (51.03 kg)  BMI 17.62 kg/m2  SpO2 97%  LMP 09/07/2015  No major acute distress. She has a large subungual hematoma of the right thumb and bruising and ecchymosis and fluctuance at the cuticle of that right thumb. She also has some decreased sensation of the thumb.  Procedure:  A hole was drilled with a 18-gauge needle in the nail and a large amount of blood expressed from the space. Patient tolerated the procedure well.  Assessment & Plan:   Assessment: 1. Subungual hematoma of right thumb, initial encounter   2. Contusion of right thumb nail, initial encounter       Plan: Local care. No other treatment needed.  No orders of the defined types were placed in this encounter.    No orders of the defined types were placed in this encounter.         Patient Instructions  Continue to try and squeeze a little blood out from the nail if it will come. Keep a Band-Aid on it. Return if further concerns.  You may end up losing the nail. The nail starts to come loose at the base of it coming in. It is generally better to have [place] remove it than to have the nail care off.  Subungual Hematoma A subungual hematoma is a pocket of blood  that collects under the fingernail or toenail. The pressure created by the blood under the nail can cause pain. CAUSES  A subungual hematoma occurs when an injury to the finger or toe causes a blood vessel beneath the nail to break. The injury can occur from a direct blow such as slamming a finger in a door. It can also occur from a repeated injury such as pressure on the foot in a shoe while running. A subungual hematoma is sometimes called runner's toe or tennis toe. SYMPTOMS   Blue or dark blue skin under the nail.  Pain or throbbing in the injured area. DIAGNOSIS  Your caregiver can determine whether you have a subungual hematoma based on your history and a physical exam. If your caregiver thinks you might have a broken (fractured) bone, X-rays may be taken. TREATMENT  Hematomas usually go away on their own over time. Your caregiver may make a hole in the nail to drain the blood. Draining the blood is painless and usually provides significant relief from pain and throbbing. The nail usually grows back normally after this procedure. In some cases, the nail may need to be removed. This is done if there is a cut under the nail that requires stitches (sutures). HOME CARE INSTRUCTIONS   Put ice on  the injured area.  Put ice in a plastic bag.  Place a towel between your skin and the bag.  Leave the ice on for 15-20 minutes, 03-04 times a day for the first 1 to 2 days.  Elevate the injured area to help decrease pain and swelling.  If you were given a bandage, wear it for as long as directed by your caregiver.  If part of your nail falls off, trim the remaining nail gently. This prevents the nail from catching on something and causing further injury.  Only take over-the-counter or prescription medicines for pain, discomfort, or fever as directed by your caregiver. SEEK IMMEDIATE MEDICAL CARE IF:   You have redness or swelling around the nail.  You have yellowish-white fluid (pus) coming  from the nail.  Your pain is not controlled with medicine.  You have a fever. MAKE SURE YOU:  Understand these instructions.  Will watch your condition.  Will get help right away if you are not doing well or get worse.   This information is not intended to replace advice given to you by your health care provider. Make sure you discuss any questions you have with your health care provider.   Document Released: 07/26/2000 Document Revised: 10/21/2011 Document Reviewed: 12/14/2014 Elsevier Interactive Patient Education [employer]      Return if symptoms worsen or fail to improve.   Peytan Andringa, MD 09/16/2015

## 2015-09-16 NOTE — Patient Instructions (Signed)
Continue to try and squeeze a little blood out from the nail if it will come. Keep a Band-Aid on it. Return if further concerns.  You may end up losing the nail. The nail starts to come loose at the base of it coming in. It is generally better to have [place] remove it than to have the nail care off.  Subungual Hematoma A subungual hematoma is a pocket of blood that collects under the fingernail or toenail. The pressure created by the blood under the nail can cause pain. CAUSES  A subungual hematoma occurs when an injury to the finger or toe causes a blood vessel beneath the nail to break. The injury can occur from a direct blow such as slamming a finger in a door. It can also occur from a repeated injury such as pressure on the foot in a shoe while running. A subungual hematoma is sometimes called runner's toe or tennis toe. SYMPTOMS   Blue or dark blue skin under the nail.  Pain or throbbing in the injured area. DIAGNOSIS  Your caregiver can determine whether you have a subungual hematoma based on your history and a physical exam. If your caregiver thinks you might have a broken (fractured) bone, X-rays may be taken. TREATMENT  Hematomas usually go away on their own over time. Your caregiver may make a hole in the nail to drain the blood. Draining the blood is painless and usually provides significant relief from pain and throbbing. The nail usually grows back normally after this procedure. In some cases, the nail may need to be removed. This is done if there is a cut under the nail that requires stitches (sutures). HOME CARE INSTRUCTIONS   Put ice on the injured area.  Put ice in a plastic bag.  Place a towel between your skin and the bag.  Leave the ice on for 15-20 minutes, 03-04 times a day for the first 1 to 2 days.  Elevate the injured area to help decrease pain and swelling.  If you were given a bandage, wear it for as long as directed by your caregiver.  If part of your nail falls  off, trim the remaining nail gently. This prevents the nail from catching on something and causing further injury.  Only take over-the-counter or prescription medicines for pain, discomfort, or fever as directed by your caregiver. SEEK IMMEDIATE MEDICAL CARE IF:   You have redness or swelling around the nail.  You have yellowish-white fluid (pus) coming from the nail.  Your pain is not controlled with medicine.  You have a fever. MAKE SURE YOU:  Understand these instructions.  Will watch your condition.  Will get help right away if you are not doing well or get worse.   This information is not intended to replace advice given to you by your health care provider. Make sure you discuss any questions you have with your health care provider.   Document Released: 07/26/2000 Document Revised: 10/21/2011 Document Reviewed: 12/14/2014 Elsevier Interactive Patient Education [employer]

## 2015-10-08 IMAGING — CR DG HAND COMPLETE 3+V*R*
3 series · 3 of 3 positions shown · non-contrast
Comparison: None.

CLINICAL DATA: Right wrist and hand pain

EXAM:
RIGHT HAND - COMPLETE 3+ VIEW

[PA]
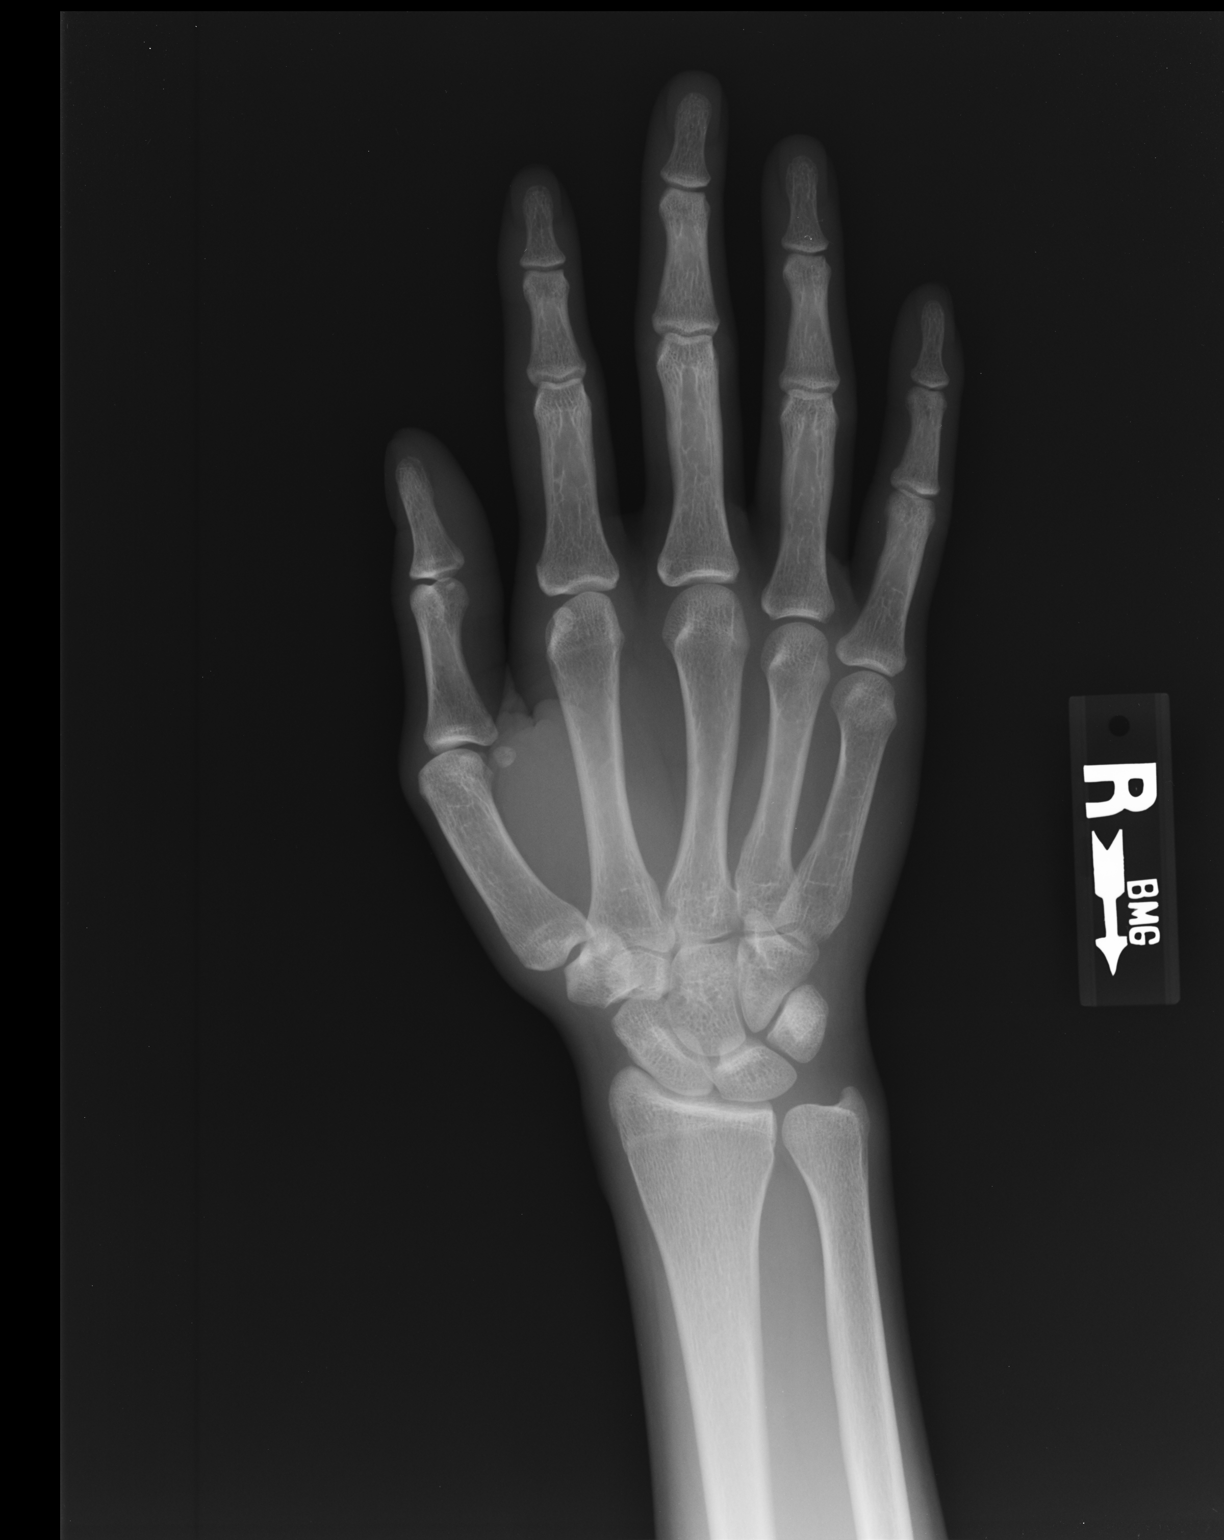

[pa obl]
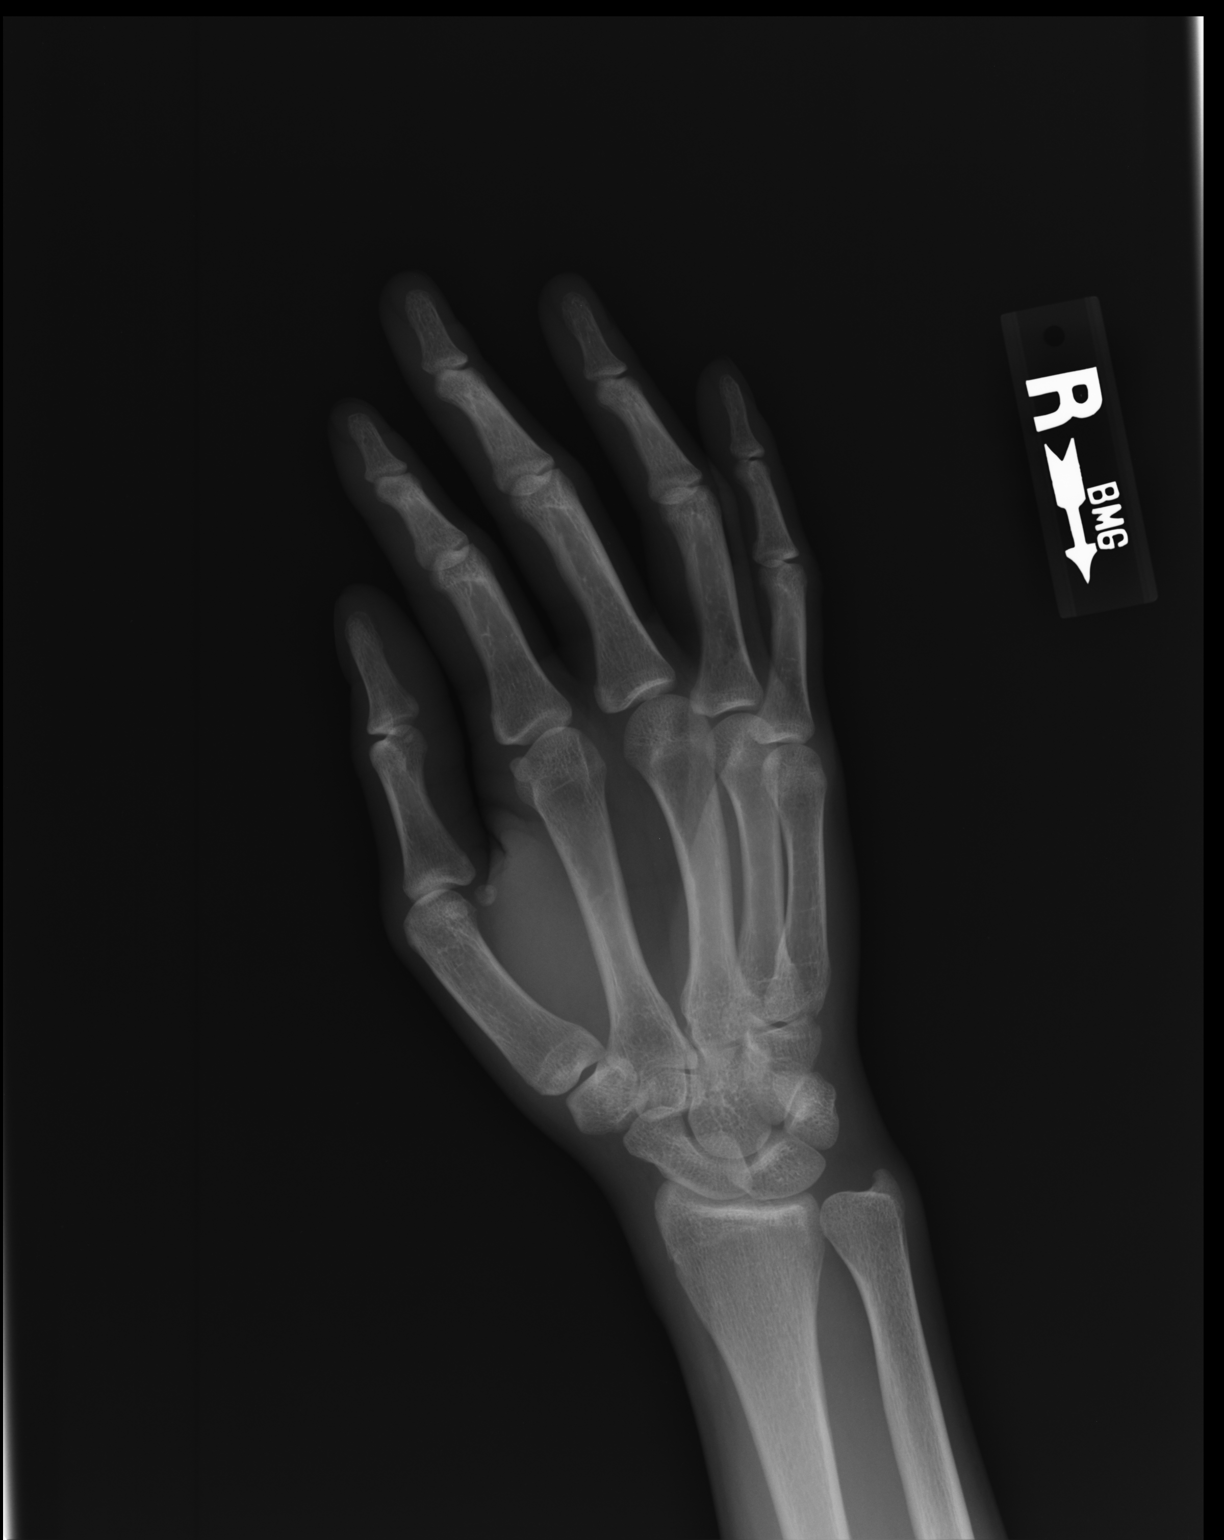

[lateral]
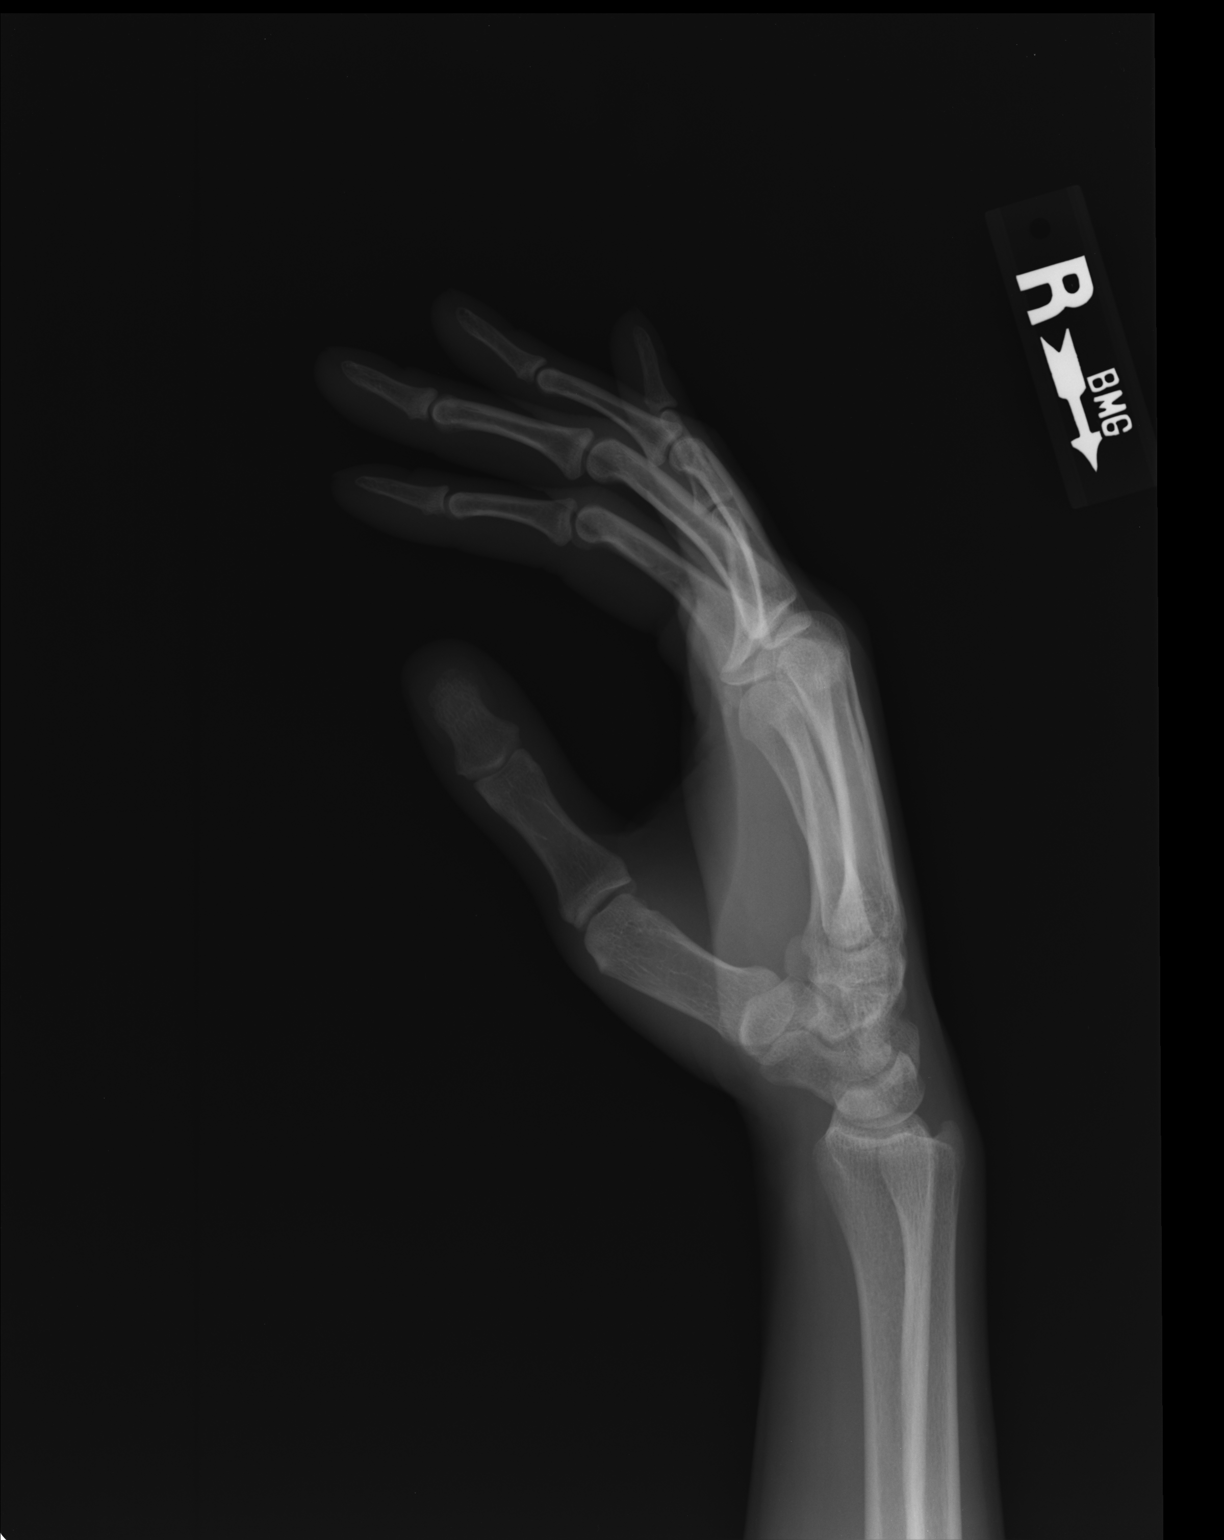

[3 of 3 positions shown; findings below may reference images not displayed]

FINDINGS: The bones of the right hand are adequately mineralized. The
interphalangeal joints, the meta carpophalangeal joints, and the
carpometacarpal joints are normal. There is no acute fracture nor
dislocation. The soft tissues are unremarkable.
IMPRESSION: There is no acute bony abnormality of the right hand.

## 2015-12-03 IMAGING — US US PELVIS COMPLETE
1 series · 13 of 25 positions shown · non-contrast
Comparison: None.

CLINICAL DATA: 18-year-old female with intermittent right lower
quadrant pain for 1 week. Patient is on OCP.

EXAM:
TRANSABDOMINAL AND TRANSVAGINAL ULTRASOUND OF PELVIS
DOPPLER ULTRASOUND OF OVARIES
TECHNIQUE: Both transabdominal and transvaginal ultrasound examinations of the
pelvis were performed. Transabdominal technique was performed for
global imaging of the pelvis including uterus, ovaries, adnexal
regions, and pelvic cul-de-sac.
It was necessary to proceed with endovaginal exam following the
transabdominal exam to visualize the endometrium, ovaries and
adnexa. Color and duplex Doppler ultrasound was utilized to evaluate
blood flow to the ovaries.

[Series 1: us pelvis complete · 0.15mm/px · 13 of 89 slices shown]
[im 1/89]
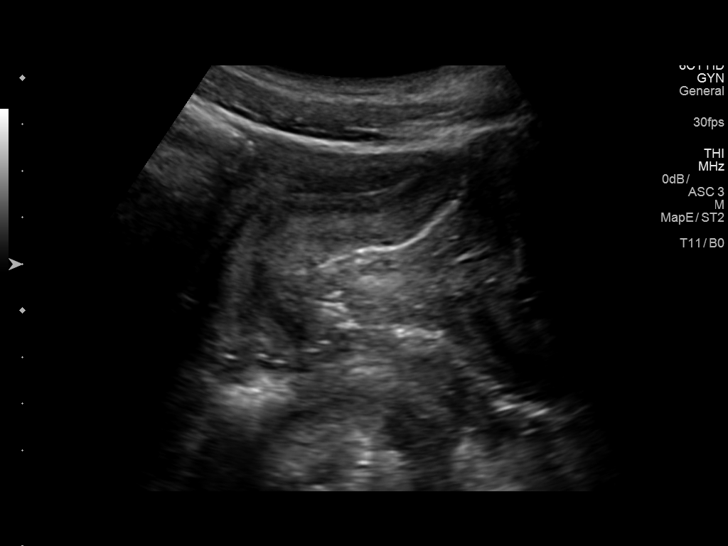
[im 8/89]
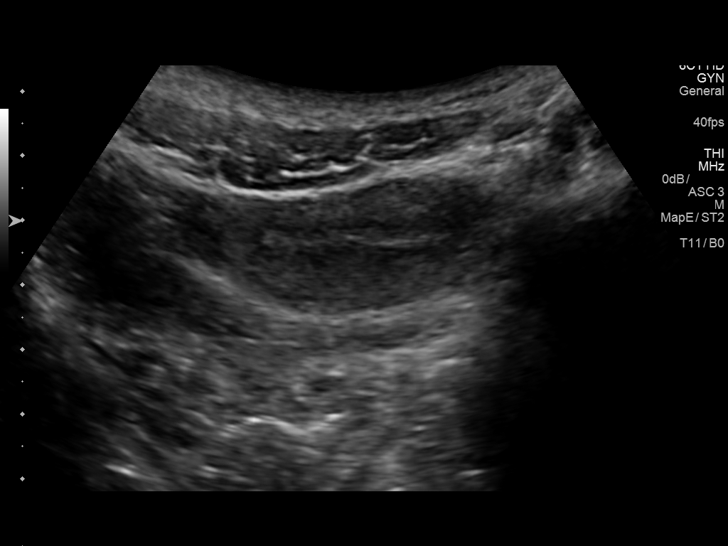
[im 15/89]
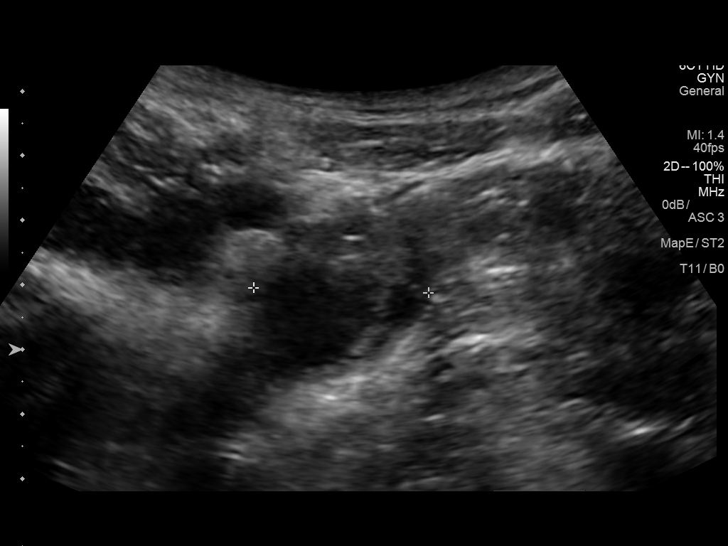
[im 23/89]
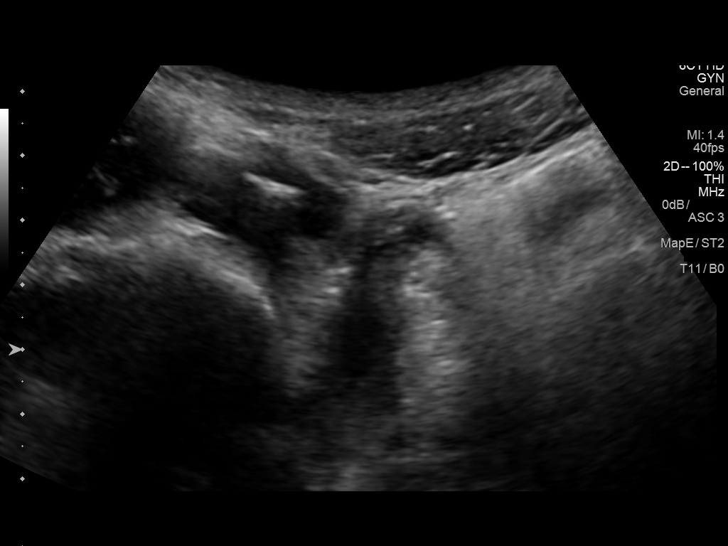
[im 30/89]
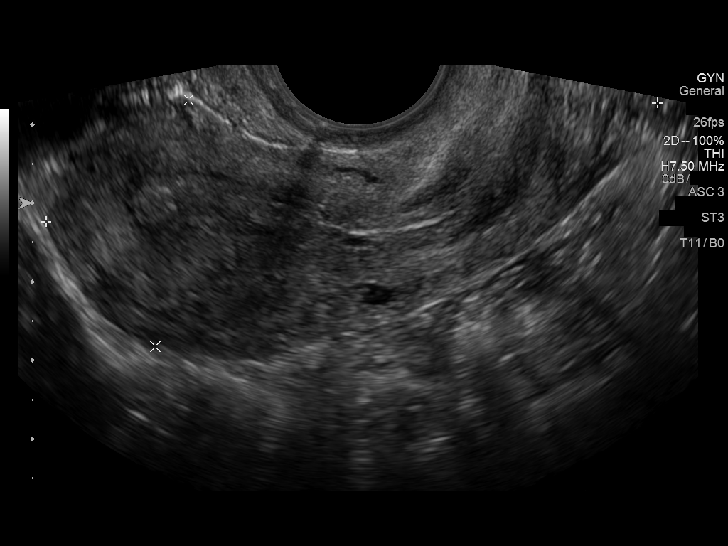
[im 37/89]
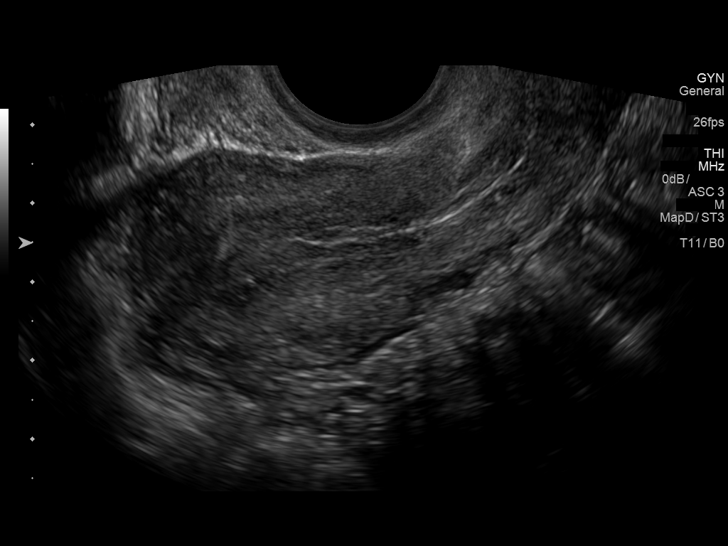
[im 45/89]
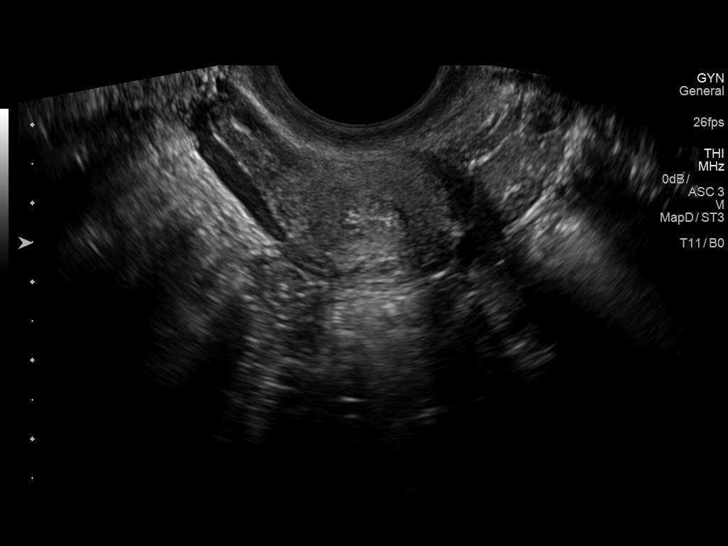
[im 52/89]
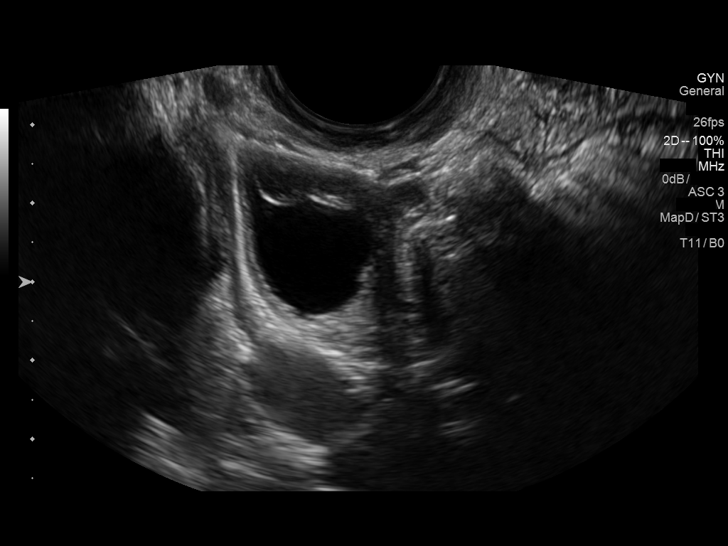
[im 59/89]
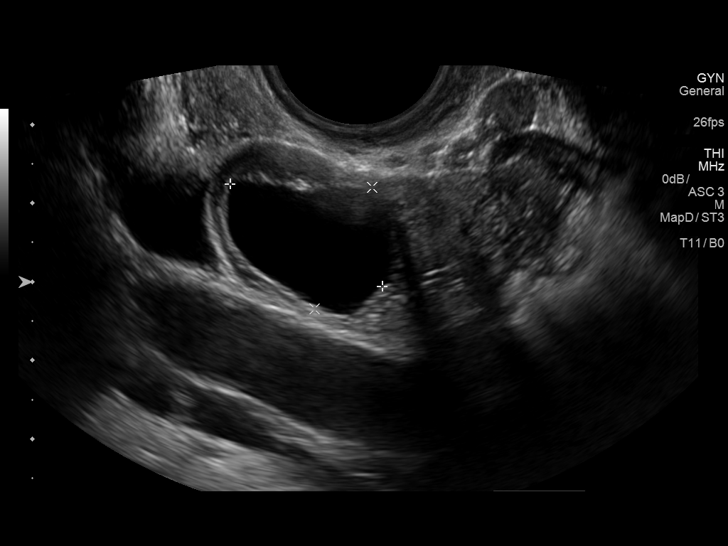
[im 67/89]
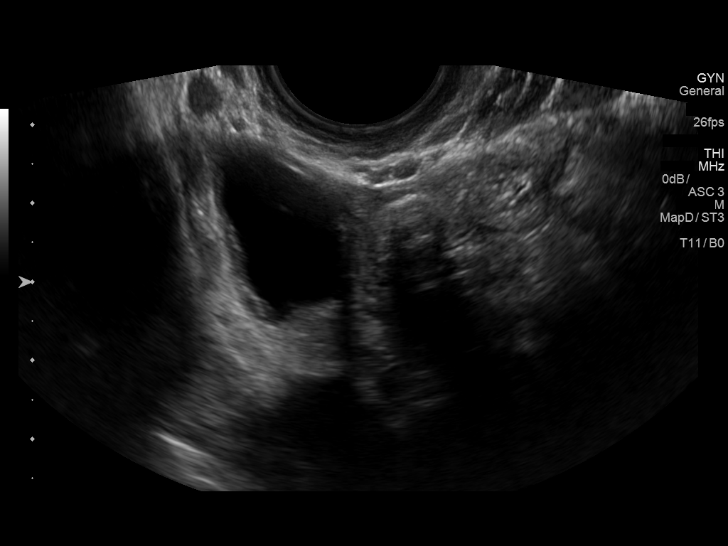
[im 74/89]
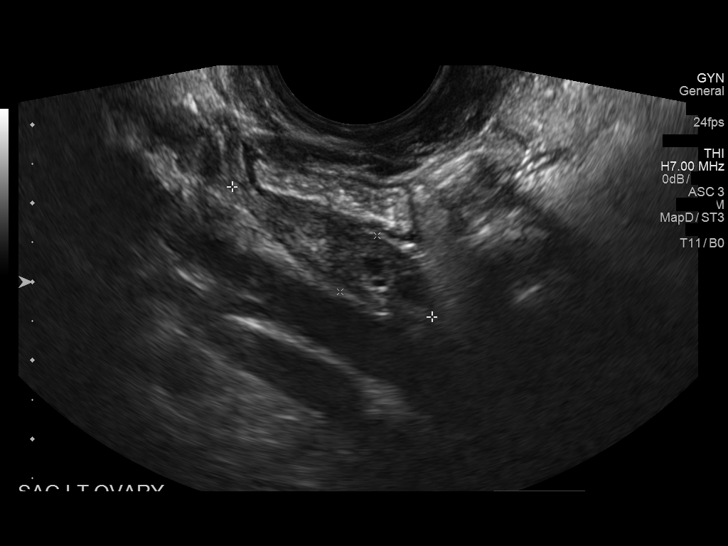
[im 81/89]
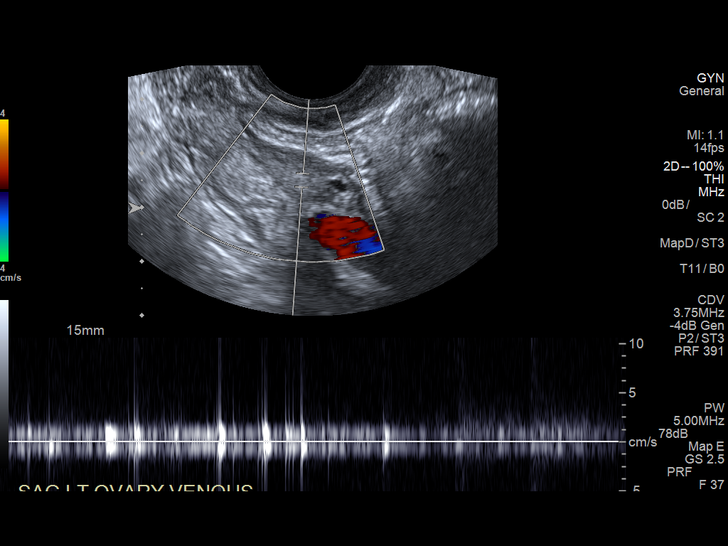
[im 89/89]
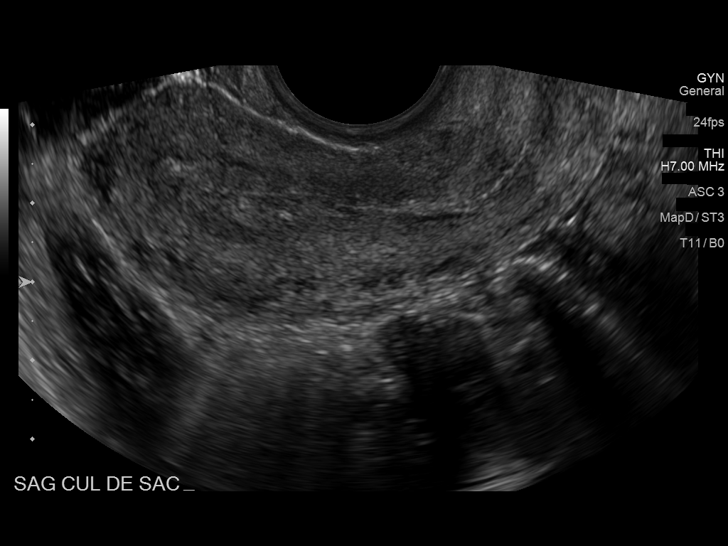

[13 of 25 positions shown; findings below may reference images not displayed]

FINDINGS: Uterus

Measurements: 7.9 x 3.2 x 3.8 cm. The anteverted anteflexed uterus
appears normal in size and configuration, with no uterine fibroids
or other myometrial abnormality.

Endometrium

Thickness:  7 mm.  No focal abnormality visualized.

Right ovary

Measurements: 3.5 x 2.7 x 2.0 cm. Normal appearance/no adnexal mass.
There is a dominant 2.3 cm simple right ovarian follicle.

Left ovary

Measurements: 3.0 x 0.9 x 1.2 cm. Normal appearance/no adnexal mass.

Pulsed Doppler evaluation of both ovaries demonstrates normal
low-resistance arterial and venous waveforms.

Other findings

No abnormal free fluid in the pelvis.
IMPRESSION: 1. No evidence of adnexal torsion. No suspicious ovarian or adnexal
findings. Dominant right ovarian 2.3 cm follicle.
2. Normal anteverted uterus.

## 2015-12-03 IMAGING — US US ABDOMEN LIMITED
1 series · 14 of 14 positions shown · non-contrast
Comparison: None.

CLINICAL DATA: Intermittent right lower quadrant pain for the past
week, symptoms made worse with bending.

EXAM:
LIMITED ABDOMINAL ULTRASOUND
TECHNIQUE: Gray scale imaging of the right lower quadrant was performed to
evaluate for suspected appendicitis. Standard imaging planes and
graded compression technique were utilized.

[Series 1: us abdomen limited · 0.07mm/px · 14 of 14 slices shown]
[im 1/14]
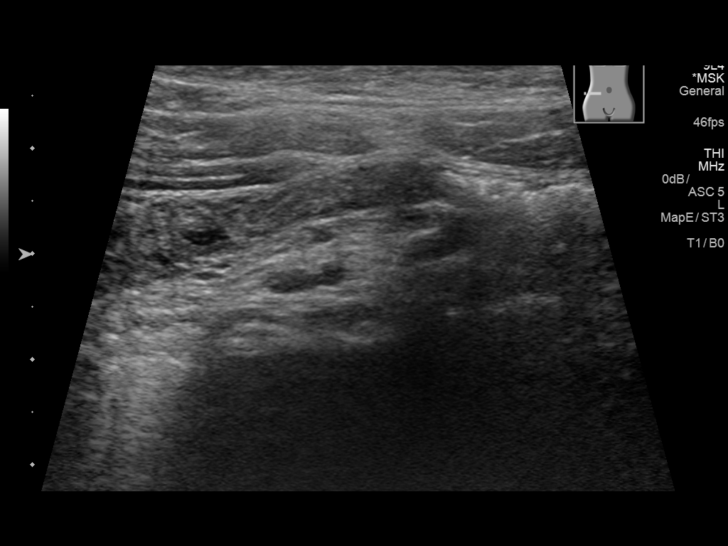
[im 2/14]
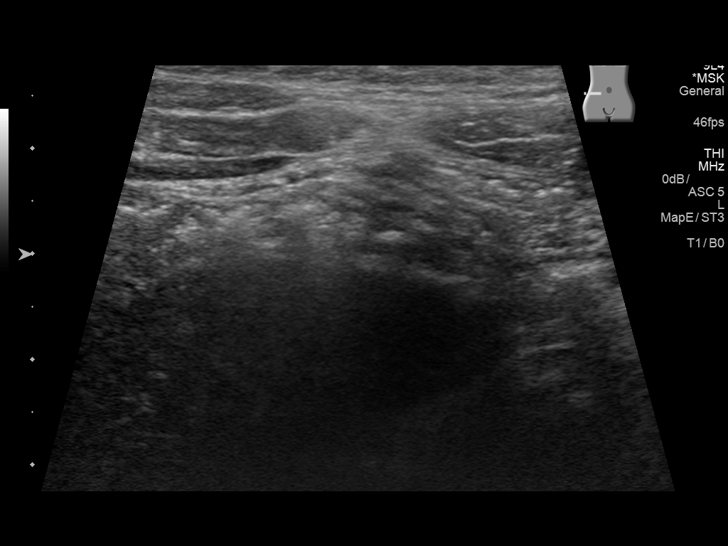
[im 3/14]
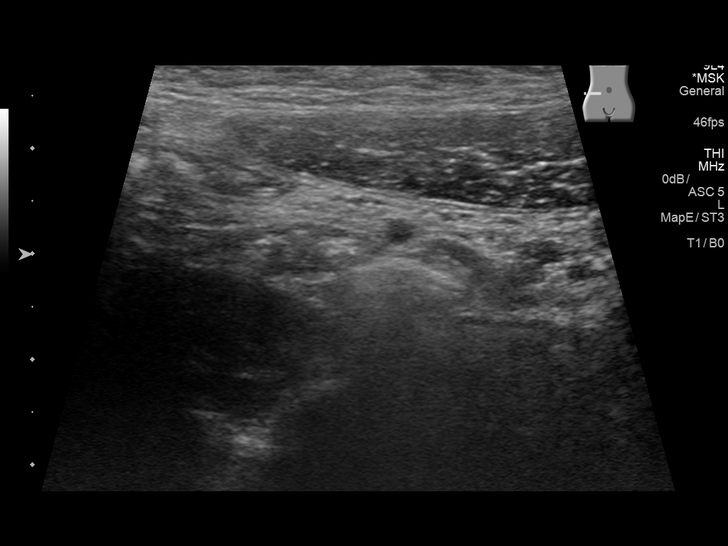
[im 4/14]
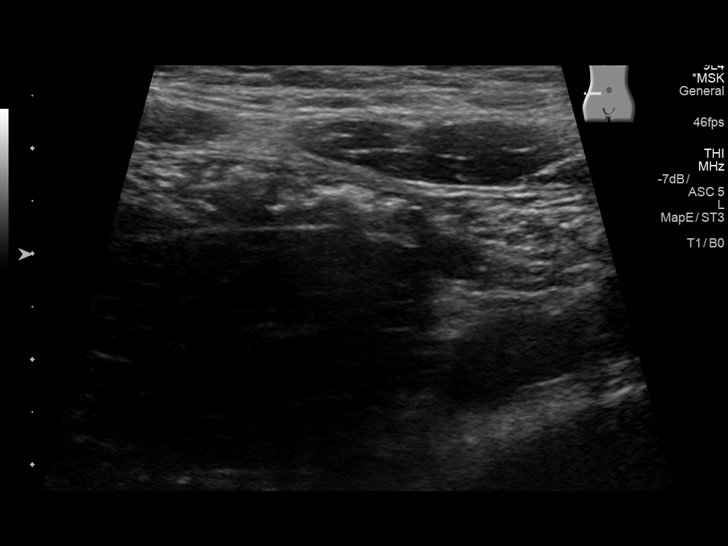
[im 5/14]
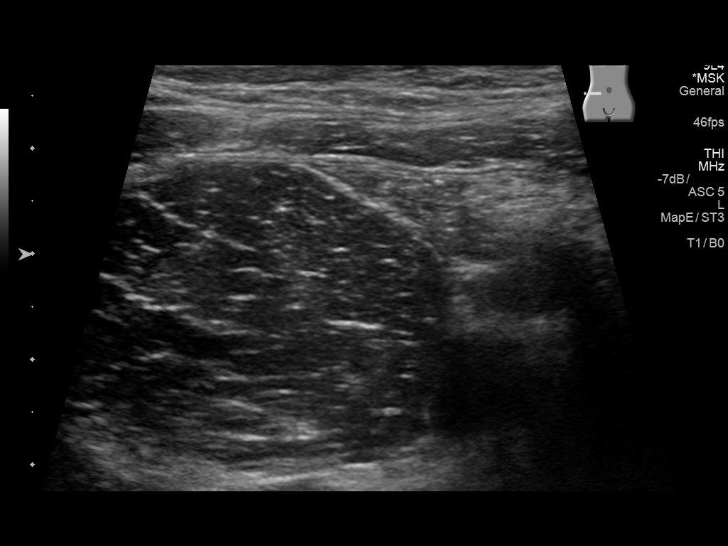
[im 6/14]
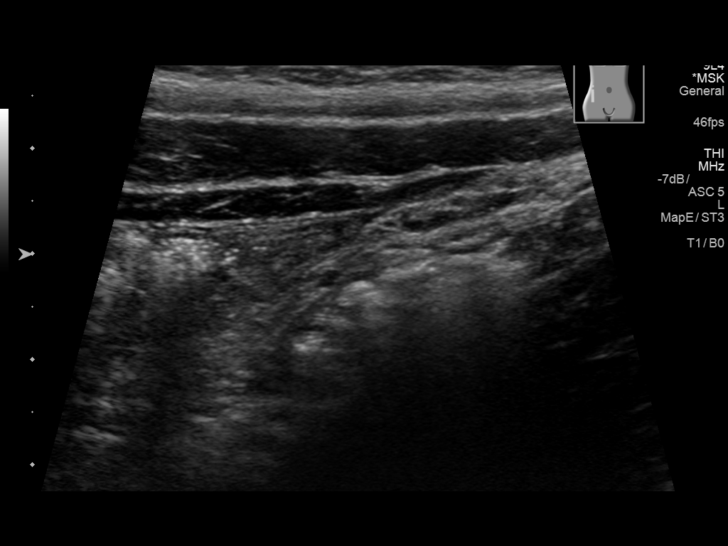
[im 7/14]
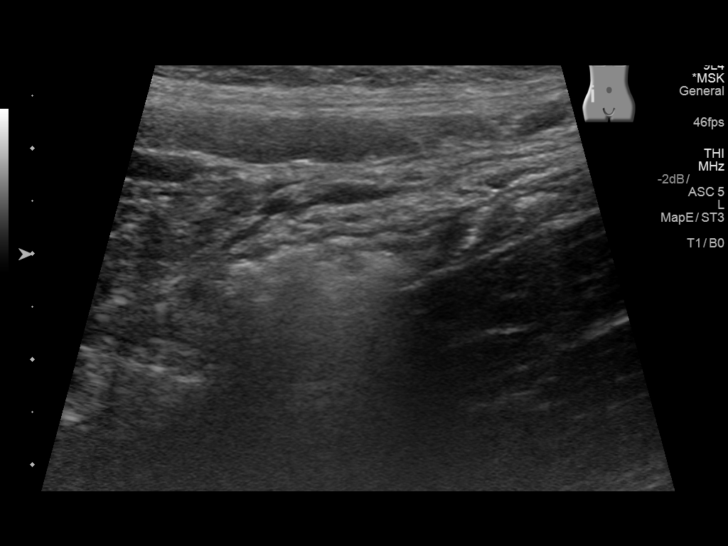
[im 8/14]
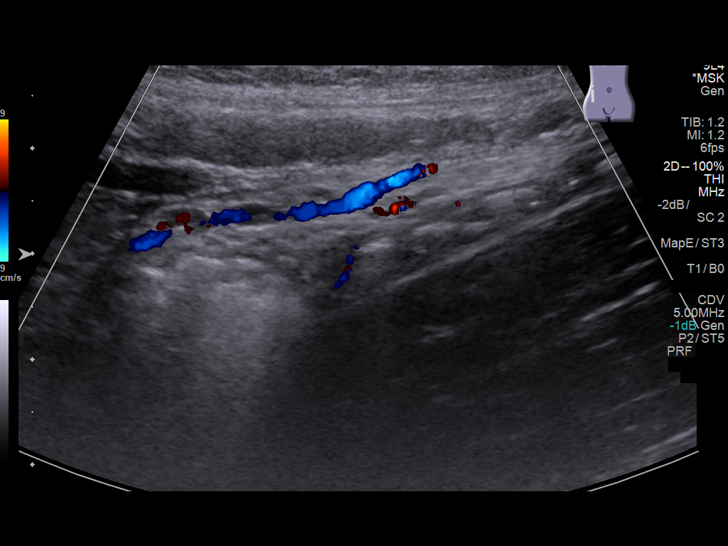
[im 9/14]
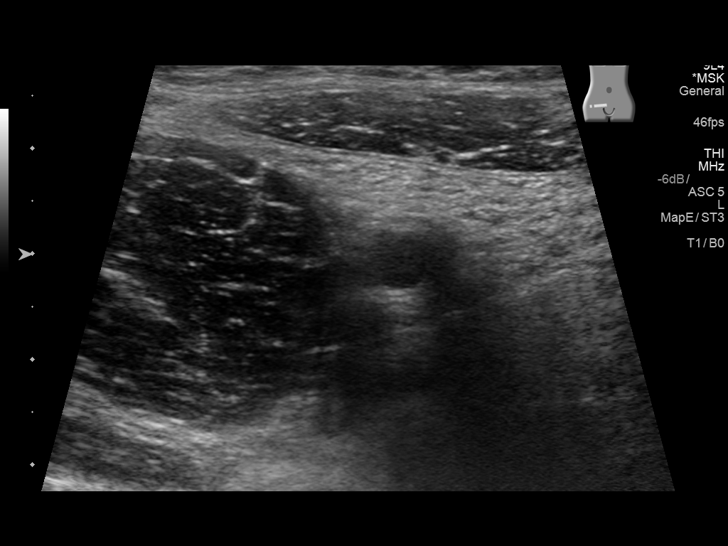
[im 10/14]
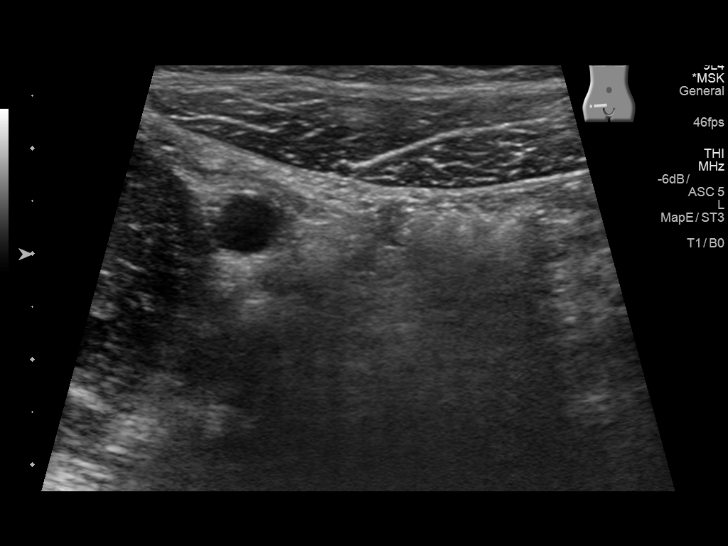
[im 11/14]
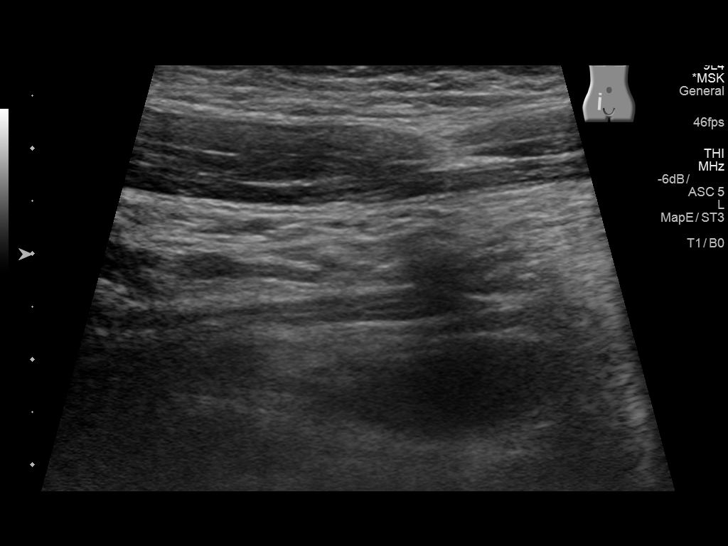
[im 12/14]
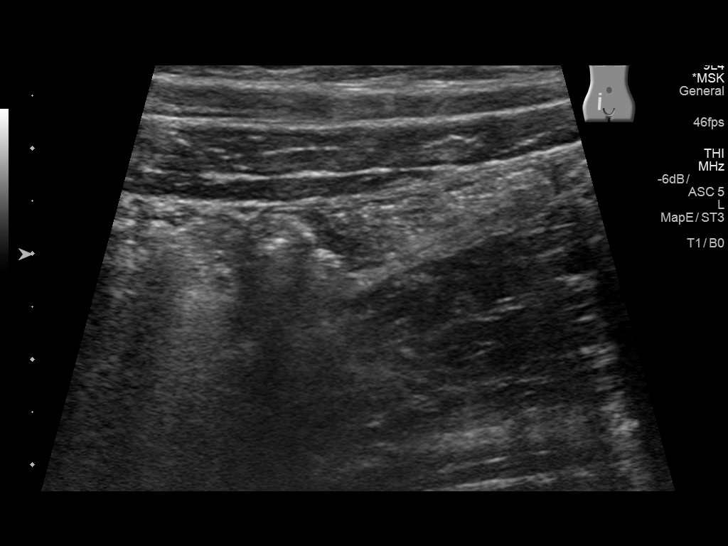
[im 13/14]
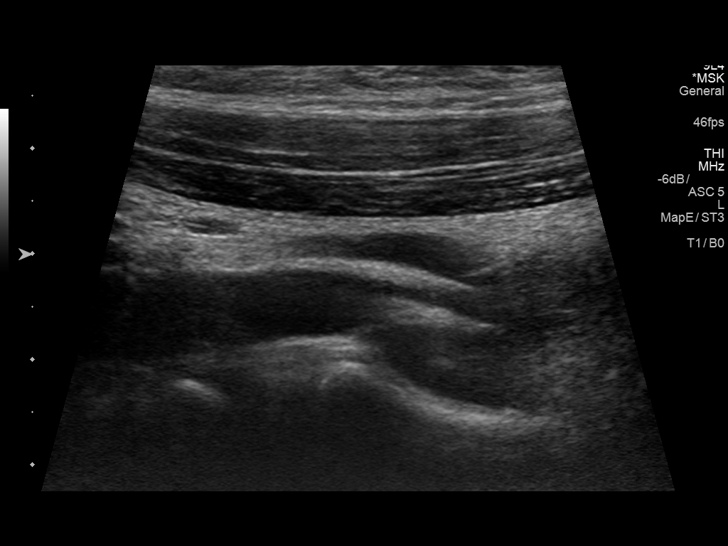
[im 14/14]
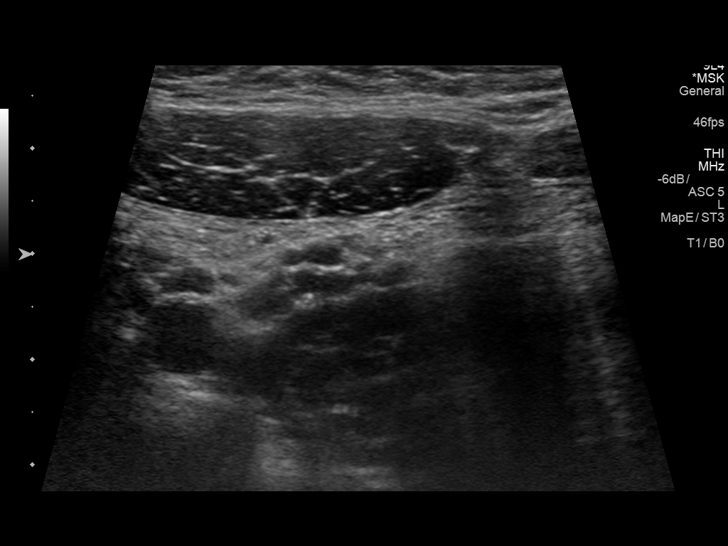

[14 of 14 positions shown; findings below may reference images not displayed]

FINDINGS: The appendix is not visualized.

Ancillary findings: Considerable bowel gas in the right lower
quadrant.

Factors affecting image quality: Bowel gas limits visualization of
structures.
IMPRESSION: The appendix could not be visualized. If there is strong clinical
concern of acute appendicitis, abdominal and pelvic CT scanning
would be the most useful next imaging step.

## 2016-06-24 ENCOUNTER — Encounter (HOSPITAL_COMMUNITY): Payer: Self-pay

## 2016-06-24 ENCOUNTER — Emergency Department (HOSPITAL_COMMUNITY)
Admission: EM | Admit: 2016-06-24 | Discharge: 2016-06-25 | Disposition: A | Discharge status: Home / Self Care | Payer: 59 | Attending: Emergency Medicine | Admitting: Emergency Medicine

## 2016-06-24 ENCOUNTER — Emergency Department (HOSPITAL_COMMUNITY): Payer: 59

## 2016-06-24 DIAGNOSIS — S61212A Laceration without foreign body of right middle finger without damage to nail, initial encounter: Secondary | ICD-10-CM | POA: Insufficient documentation

## 2016-06-24 DIAGNOSIS — R6884 Jaw pain: Secondary | ICD-10-CM | POA: Diagnosis not present

## 2016-06-24 DIAGNOSIS — Y9289 Other specified places as the place of occurrence of the external cause: Secondary | ICD-10-CM | POA: Diagnosis not present

## 2016-06-24 DIAGNOSIS — S80812A Abrasion, left lower leg, initial encounter: Secondary | ICD-10-CM | POA: Diagnosis not present

## 2016-06-24 DIAGNOSIS — F17200 Nicotine dependence, unspecified, uncomplicated: Secondary | ICD-10-CM | POA: Diagnosis not present

## 2016-06-24 DIAGNOSIS — Y9389 Activity, other specified: Secondary | ICD-10-CM | POA: Diagnosis not present

## 2016-06-24 DIAGNOSIS — S6991XA Unspecified injury of right wrist, hand and finger(s), initial encounter: Secondary | ICD-10-CM | POA: Diagnosis present

## 2016-06-24 DIAGNOSIS — Y999 Unspecified external cause status: Secondary | ICD-10-CM | POA: Diagnosis not present

## 2016-06-24 MED ORDER — HYDROCODONE-ACETAMINOPHEN 5-325 MG PO TABS
1.0000 | ORAL_TABLET | Freq: Once | ORAL | Status: AC
  Administered 2016-06-24: 1 via ORAL
  Filled 2016-06-24: qty 1

## 2016-06-24 NOTE — Progress Notes (Signed)
Orthopedic Tech Progress Note Patient Details:  [place] BingJazzmine D Christner 12/12/1995 [id number]  Ortho Devices Type of Ortho Device: Knee Sleeve Ortho Device/Splint Location: rle Ortho Device/Splint Interventions: Ordered, Application   Trinna PostMartinez, Britnay Magnussen J 06/24/2016, 11:41 PM
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
## 2016-06-24 NOTE — Discharge Instructions (Signed)
Take tylenol or ibuprofen every 6-8 hrs for pain Ice may help reduce pain  Wear knee sleeve for additional support and stability  Return to ED if you develop severe sudden headache, difficulty controlling oral secretions, numbness, tingling or numbness in lower extremities
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
## 2016-06-29 ENCOUNTER — Inpatient Hospital Stay (HOSPITAL_COMMUNITY)
Admission: AD | Admit: 2016-06-29 | Discharge: 2016-06-29 | Disposition: A | Discharge status: Home / Self Care | Payer: 59 | Source: Ambulatory Visit | Attending: Obstetrics and Gynecology | Admitting: Obstetrics and Gynecology

## 2016-06-29 ENCOUNTER — Encounter (HOSPITAL_COMMUNITY): Payer: Self-pay | Admitting: *Deleted

## 2016-06-29 DIAGNOSIS — F17200 Nicotine dependence, unspecified, uncomplicated: Secondary | ICD-10-CM | POA: Insufficient documentation

## 2016-06-29 DIAGNOSIS — Z888 Allergy status to other drugs, medicaments and biological substances status: Secondary | ICD-10-CM | POA: Insufficient documentation

## 2016-06-29 DIAGNOSIS — R110 Nausea: Secondary | ICD-10-CM | POA: Insufficient documentation

## 2016-06-29 DIAGNOSIS — R51 Headache: Secondary | ICD-10-CM | POA: Insufficient documentation

## 2016-06-29 DIAGNOSIS — Z3202 Encounter for pregnancy test, result negative: Secondary | ICD-10-CM | POA: Diagnosis not present

## 2016-06-29 LAB — POCT PREGNANCY, URINE: Preg Test, Ur: NEGATIVE

## 2016-06-29 LAB — URINALYSIS, ROUTINE W REFLEX MICROSCOPIC
Bilirubin Urine: NEGATIVE
Glucose, UA: NEGATIVE mg/dL
Hgb urine dipstick: NEGATIVE
KETONES UR: NEGATIVE mg/dL
NITRITE: NEGATIVE
PROTEIN: NEGATIVE mg/dL
Specific Gravity, Urine: 1.015 (ref 1.005–1.030)
pH: 6 (ref 5.0–8.0)

## 2016-06-29 LAB — URINE MICROSCOPIC-ADD ON
Bacteria, UA: NONE SEEN
RBC / HPF: NONE SEEN RBC/hpf (ref 0–5)
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
## 2016-06-29 NOTE — MAU Note (Addendum)
Patient presents with 2 periods in a row with only spotting, last time was mid October, yesterday had abdominal pain, feeling nausea all day for the past 2 weeks, denies dysuria, having increased white discharge with no odor, has taken [place] pregnancy tests all negative most recent last week, denies pain today. Has been recently [facility]. Has been having unusual headaches, had headache this morning.

## 2016-06-29 NOTE — Discharge Instructions (Signed)
Use condoms every time you have sex. Be seen in the office for follow up.

## 2016-07-19 DIAGNOSIS — A599 Trichomoniasis, unspecified: Secondary | ICD-10-CM | POA: Insufficient documentation

## 2016-08-14 ENCOUNTER — Inpatient Hospital Stay (HOSPITAL_COMMUNITY)
Admission: AD | Admit: 2016-08-14 | Discharge: 2016-08-14 | Disposition: A | Discharge status: Home / Self Care | Payer: 59 | Source: Ambulatory Visit | Attending: Obstetrics & Gynecology | Admitting: Obstetrics & Gynecology

## 2016-08-14 ENCOUNTER — Encounter (HOSPITAL_COMMUNITY): Payer: Self-pay | Admitting: *Deleted

## 2016-08-14 DIAGNOSIS — Z0189 Encounter for other specified special examinations: Secondary | ICD-10-CM | POA: Diagnosis not present

## 2016-08-14 LAB — URINALYSIS, ROUTINE W REFLEX MICROSCOPIC
BACTERIA UA: NONE SEEN
Bilirubin Urine: NEGATIVE
GLUCOSE, UA: NEGATIVE mg/dL
KETONES UR: NEGATIVE mg/dL
Leukocytes, UA: NEGATIVE
Nitrite: NEGATIVE
PROTEIN: NEGATIVE mg/dL
Specific Gravity, Urine: 1.004 — ABNORMAL LOW (ref 1.005–1.030)
pH: 7 (ref 5.0–8.0)

## 2016-08-14 LAB — POCT PREGNANCY, URINE: PREG TEST UR: NEGATIVE

## 2016-08-14 NOTE — Discharge Instructions (Signed)
Third Trimester of Pregnancy °The third trimester is from week 29 through week 40 (months 7 through 9). The third trimester is a time when the unborn baby (fetus) is growing rapidly. At the end of the ninth month, the fetus is about 20 inches in length and weighs 6-10 pounds. °Body changes during your third trimester °Your body goes through many changes during pregnancy. The changes vary from woman to woman. During the third trimester: °· Your weight will continue to increase. You can expect to gain 25-35 pounds (11-16 kg) by the end of the pregnancy. °· You may begin to get stretch marks on your hips, abdomen, and breasts. °· You may urinate more often because the fetus is moving lower into your pelvis and pressing on your bladder. °· You may develop or continue to have heartburn. This is caused by increased hormones that slow down muscles in the digestive tract. °· You may develop or continue to have constipation because increased hormones slow digestion and cause the muscles that push waste through your intestines to relax. °· You may develop hemorrhoids. These are swollen veins (varicose veins) in the rectum that can itch or be painful. °· You may develop swollen, bulging veins (varicose veins) in your legs. °· You may have increased body aches in the pelvis, back, or thighs. This is due to weight gain and increased hormones that are relaxing your joints. °· You may have changes in your hair. These can include thickening of your hair, rapid growth, and changes in texture. Some women also have hair loss during or after pregnancy, or hair that feels dry or thin. Your hair will most likely return to normal after your baby is born. °· Your breasts will continue to grow and they will continue to become tender. A yellow fluid (colostrum) may leak from your breasts. This is the first milk you are producing for your baby. °· Your belly button may stick out. °· You may notice more swelling in your hands, face, or  ankles. °· You may have increased tingling or numbness in your hands, arms, and legs. The skin on your belly may also feel numb. °· You may feel short of breath because of your expanding uterus. °· You may have more problems sleeping. This can be caused by the size of your belly, increased need to urinate, and an increase in your body's metabolism. °· You may notice the fetus "dropping," or moving lower in your abdomen. °· You may have increased vaginal discharge. °· Your cervix becomes thin and soft (effaced) near your due date. °What to expect at prenatal visits °You will have prenatal exams every 2 weeks until week 36. Then you will have weekly prenatal exams. During a routine prenatal visit: °· You will be weighed to make sure you and the fetus are growing normally. °· Your blood pressure will be taken. °· Your abdomen will be measured to track your baby's growth. °· The fetal heartbeat will be listened to. °· Any test results from the previous visit will be discussed. °· You may have a cervical check near your due date to see if you have effaced. °At around 36 weeks, your health care provider will check your cervix. At the same time, your health care provider will also perform a test on the secretions of the vaginal tissue. This test is to determine if a type of bacteria, Group B streptococcus, is present. Your health care provider will explain this further. °Your health care provider may ask you: °·   What your birth plan is. °· How you are feeling. °· If you are feeling the baby move. °· If you have had any abnormal symptoms, such as leaking fluid, bleeding, severe headaches, or abdominal cramping. °· If you are using any tobacco products, including cigarettes, chewing tobacco, and electronic cigarettes. °· If you have any questions. °Other tests or screenings that may be performed during your third trimester include: °· Blood tests that check for low iron levels (anemia). °· Fetal testing to check the health,  activity level, and growth of the fetus. Testing is done if you have certain medical conditions or if there are problems during the pregnancy. °· Nonstress test (NST). This test checks the health of your baby to make sure there are no signs of problems, such as the baby not getting enough oxygen. During this test, a belt is placed around your belly. The baby is made to move, and its heart rate is monitored during movement. °What is false labor? °False labor is a condition in which you feel small, irregular tightenings of the muscles in the womb (contractions) that eventually go away. These are called Braxton Hicks contractions. Contractions may last for hours, days, or even weeks before true labor sets in. If contractions come at regular intervals, become more frequent, increase in intensity, or become painful, you should see your health care provider. °What are the signs of labor? °· Abdominal cramps. °· Regular contractions that start at 10 minutes apart and become stronger and more frequent with time. °· Contractions that start on the top of the uterus and spread down to the lower abdomen and back. °· Increased pelvic pressure and dull back pain. °· A watery or bloody mucus discharge that comes from the vagina. °· Leaking of amniotic fluid. This is also known as your "water breaking." It could be a slow trickle or a gush. Let your doctor know if it has a color or strange odor. °If you have any of these signs, call your health care provider right away, even if it is before your due date. °Follow these instructions at home: °Eating and drinking °· Continue to eat regular, healthy meals. °· Do not eat: °¨ Raw meat or meat spreads. °¨ Unpasteurized milk or cheese. °¨ Unpasteurized juice. °¨ Store-made salad. °¨ Refrigerated smoked seafood. °¨ Hot dogs or deli meat, unless they are piping hot. °¨ More than 6 ounces of albacore tuna a week. °¨ Shark, swordfish, king mackerel, or tile fish. °¨ Store-made salads. °¨ Raw  sprouts, such as mung bean or alfalfa sprouts. °· Take prenatal vitamins as told by your health care provider. °· Take 1000 mg of calcium daily as told by your health care provider. °· If you develop constipation: °¨ Take over-the-counter or prescription medicines. °¨ Drink enough fluid to keep your urine clear or pale yellow. °¨ Eat foods that are high in fiber, such as fresh fruits and vegetables, whole grains, and beans. °¨ Limit foods that are high in fat and processed sugars, such as fried and sweet foods. °Activity °· Exercise only as directed by your health care provider. Healthy pregnant women should aim for 2 hours and 30 minutes of moderate exercise per week. If you experience any pain or discomfort while exercising, stop. °· Avoid heavy lifting. °· Do not exercise in extreme heat or humidity, or at high altitudes. °· Wear low-heel, comfortable shoes. °· Practice good posture. °· Do not travel far distances unless it is absolutely necessary and only with the approval   of your health care provider. °· Wear your seat belt at all times while in a car, on a bus, or on a plane. °· Take frequent breaks and rest with your legs elevated if you have leg cramps or low back pain. °· Do not use hot tubs, steam rooms, or saunas. °· You may continue to have sex unless your health care provider tells you otherwise. °Lifestyle °· Do not use any products that contain nicotine or tobacco, such as cigarettes and e-cigarettes. If you need help quitting, ask your health care provider. °· Do not drink alcohol. °· Do not use any medicinal herbs or unprescribed drugs. These chemicals affect the formation and growth of the baby. °· If you develop varicose veins: °¨ Wear support pantyhose or compression stockings as told by your healthcare provider. °¨ Elevate your feet for 15 minutes, 3-4 times a day. °· Wear a supportive maternity bra to help with breast tenderness. °General instructions °· Take over-the-counter and prescription  medicines only as told by your health care provider. There are medicines that are either safe or unsafe to take during pregnancy. °· Take warm sitz baths to soothe any pain or discomfort caused by hemorrhoids. Use hemorrhoid cream or witch hazel if your health care provider approves. °· Avoid cat litter boxes and soil used by cats. These carry germs that can cause birth defects in the baby. If you have a cat, ask someone to clean the litter box for you. °· To prepare for the arrival of your baby: °¨ Take prenatal classes to understand, practice, and ask questions about the labor and delivery. °¨ Make a trial run to the hospital. °¨ Visit the hospital and tour the maternity area. °¨ Arrange for maternity or paternity leave through employers. °¨ Arrange for family and friends to take care of pets while you are in the hospital. °¨ Purchase a rear-facing car seat and make sure you know how to install it in your car. °¨ Pack your hospital bag. °¨ Prepare the baby’s nursery. Make sure to remove all pillows and stuffed animals from the baby's crib to prevent suffocation. °· Visit your dentist if you have not gone during your pregnancy. Use a soft toothbrush to brush your teeth and be gentle when you floss. °· Keep all prenatal follow-up visits as told by your health care provider. This is important. °Contact a health care provider if: °· You are unsure if you are in labor or if your water has broken. °· You become dizzy. °· You have mild pelvic cramps, pelvic pressure, or nagging pain in your abdominal area. °· You have lower back pain. °· You have persistent nausea, vomiting, or diarrhea. °· You have an unusual or bad smelling vaginal discharge. °· You have pain when you urinate. °Get help right away if: °· You have a fever. °· You are leaking fluid from your vagina. °· You have spotting or bleeding from your vagina. °· You have severe abdominal pain or cramping. °· You have rapid weight loss or weight gain. °· You have  shortness of breath with chest pain. °· You notice sudden or extreme swelling of your face, hands, ankles, feet, or legs. °· Your baby makes fewer than 10 movements in 2 hours. °· You have severe headaches that do not go away with medicine. °· You have vision changes. °Summary °· The third trimester is from week 29 through week 40, months 7 through 9. The third trimester is a time when the unborn baby (fetus)   is growing rapidly. °· During the third trimester, your discomfort may increase as you and your baby continue to gain weight. You may have abdominal, leg, and back pain, sleeping problems, and an increased need to urinate. °· During the third trimester your breasts will keep growing and they will continue to become tender. A yellow fluid (colostrum) may leak from your breasts. This is the first milk you are producing for your baby. °· False labor is a condition in which you feel small, irregular tightenings of the muscles in the womb (contractions) that eventually go away. These are called Braxton Hicks contractions. Contractions may last for hours, days, or even weeks before true labor sets in. °· Signs of labor can include: abdominal cramps; regular contractions that start at 10 minutes apart and become stronger and more frequent with time; watery or bloody mucus discharge that comes from the vagina; increased pelvic pressure and dull back pain; and leaking of amniotic fluid. °This information is not intended to replace advice given to you by your health care provider. Make sure you discuss any questions you have with your health care provider. °Document Released: 07/23/2001 Document Revised: 01/04/2016 Document Reviewed: 09/29/2012 °Elsevier Interactive Patient Education © 2017 [employer] °Introduction °Patient Name: ________________________________________________ Patient Due Date: ____________________ °What is a fetal movement count? °A fetal movement count is the number of times that you feel your baby  move during a certain amount of time. This may also be called a fetal kick count. A fetal movement count is recommended for every pregnant woman. You may be asked to start counting fetal movements as early as week 28 of your pregnancy. °Pay attention to when your baby is most active. You may notice your baby's sleep and wake cycles. You may also notice things that make your baby move more. You should do a fetal movement count: °· When your baby is normally most active. °· At the same time each day. °A good time to count movements is while you are resting, after having something to eat and drink. °How do I count fetal movements? °1. Find a quiet, comfortable area. Sit, or lie down on your side. °2. Write down the date, the start time and stop time, and the number of movements that you felt between those two times. Take this information with you to your health care visits. °3. For 2 hours, count kicks, flutters, swishes, rolls, and jabs. You should feel at least 10 movements during 2 hours. °4. You may stop counting after you have felt 10 movements. °5. If you do not feel 10 movements in 2 hours, have something to eat and drink. Then, keep resting and counting for 1 hour. If you feel at least 4 movements during that hour, you may stop counting. °Contact a health care provider if: °· You feel fewer than 4 movements in 2 hours. °· Your baby is not moving like he or she usually does. °Date: ____________ Start time: ____________ Stop time: ____________ Movements: ____________ °Date: ____________ Start time: ____________ Stop time: ____________ Movements: ____________ °Date: ____________ Start time: ____________ Stop time: ____________ Movements: ____________ °Date: ____________ Start time: ____________ Stop time: ____________ Movements: ____________ °Date: ____________ Start time: ____________ Stop time: ____________ Movements: ____________ °Date: ____________ Start time: ____________ Stop time: ____________ Movements:  ____________ °Date: ____________ Start time: ____________ Stop time: ____________ Movements: ____________ °Date: ____________ Start time: ____________ Stop time: ____________ Movements: ____________ °Date: ____________ Start time: ____________ Stop time: ____________ Movements: ____________ °This information is not intended to replace   advice given to you by your health care provider. Make sure you discuss any questions you have with your health care provider. °Document Released: 08/28/2006 Document Revised: 03/27/2016 Document Reviewed: 09/07/2015 °Elsevier Interactive Patient Education © 2017 [employer] ° °

## 2016-08-14 NOTE — MAU Note (Signed)
Pt left before being evaluated

## 2016-08-14 NOTE — MAU Note (Signed)
Went to OB/GYN this morning, came on period 3 times last month.  Having abd pain, feels sick, queazy.  Went to see what was wrong, they drew blood, but didn't get any answers.  Is only spotting, but cramping feels like a full blown period.

## 2016-11-17 ENCOUNTER — Emergency Department (HOSPITAL_COMMUNITY)
Admission: EM | Admit: 2016-11-17 | Discharge: 2016-11-17 | Disposition: A | Discharge status: Home / Self Care | Payer: 59 | Attending: Emergency Medicine | Admitting: Emergency Medicine

## 2016-11-17 ENCOUNTER — Encounter (HOSPITAL_COMMUNITY): Payer: Self-pay | Admitting: Emergency Medicine

## 2016-11-17 DIAGNOSIS — R309 Painful micturition, unspecified: Secondary | ICD-10-CM | POA: Diagnosis present

## 2016-11-17 DIAGNOSIS — F17200 Nicotine dependence, unspecified, uncomplicated: Secondary | ICD-10-CM | POA: Diagnosis not present

## 2016-11-17 DIAGNOSIS — Z3202 Encounter for pregnancy test, result negative: Secondary | ICD-10-CM | POA: Diagnosis not present

## 2016-11-17 DIAGNOSIS — Z202 Contact with and (suspected) exposure to infections with a predominantly sexual mode of transmission: Secondary | ICD-10-CM

## 2016-11-17 DIAGNOSIS — N390 Urinary tract infection, site not specified: Secondary | ICD-10-CM | POA: Diagnosis not present

## 2016-11-17 LAB — URINALYSIS, ROUTINE W REFLEX MICROSCOPIC
Bilirubin Urine: NEGATIVE
GLUCOSE, UA: NEGATIVE mg/dL
Hgb urine dipstick: NEGATIVE
Ketones, ur: NEGATIVE mg/dL
NITRITE: NEGATIVE
PH: 7 (ref 5.0–8.0)
Protein, ur: NEGATIVE mg/dL
Specific Gravity, Urine: 1.018 (ref 1.005–1.030)

## 2016-11-17 LAB — POC URINE PREG, ED: PREG TEST UR: NEGATIVE

## 2016-11-17 LAB — WET PREP, GENITAL
Sperm: NONE SEEN
Trich, Wet Prep: NONE SEEN
Yeast Wet Prep HPF POC: NONE SEEN

## 2016-11-17 MED ORDER — STERILE WATER FOR INJECTION IJ SOLN
INTRAMUSCULAR | Status: AC
  Filled 2016-11-17: qty 10

## 2016-11-17 MED ORDER — NITROFURANTOIN MONOHYD MACRO 100 MG PO CAPS: 100.0000 mg | ORAL_CAPSULE | Freq: Two times a day (BID) | ORAL | 0 refills | Status: DC

## 2016-11-17 MED ORDER — CEFTRIAXONE SODIUM 250 MG IJ SOLR
250.0000 mg | Freq: Once | INTRAMUSCULAR | Status: AC
Start: 2016-11-17 — End: 2016-11-17
  Administered 2016-11-17: 250 mg via INTRAMUSCULAR
  Filled 2016-11-17: qty 250

## 2016-11-17 MED ORDER — METRONIDAZOLE 500 MG PO TABS
500.0000 mg | ORAL_TABLET | Freq: Two times a day (BID) | ORAL | 0 refills | Status: DC
Start: 2016-11-17 — End: 2017-03-20

## 2016-11-17 MED ORDER — AZITHROMYCIN 250 MG PO TABS
1000.0000 mg | ORAL_TABLET | Freq: Once | ORAL | Status: AC
  Administered 2016-11-17: 1000 mg via ORAL
  Filled 2016-11-17: qty 4

## 2016-11-17 NOTE — ED Provider Notes (Signed)
[facility]-EMERGENCY DEPT Provider Note   CSN: [id number] Arrival date & time: 11/17/16  0907     History   Chief Complaint Chief Complaint  Patient presents with  . Possible Pregnancy  . STD screening    HPI Pamela Sharp is a 21 y.o. female.  Pt presents w STD exposure that occurred last week. Reports she took her sexual partner to clinic last week for testing and overheard him talking about needing an injection for tx. Pt also reports mild discomfort after urination. Pt denies VB, vaginal discharge, dysuria, hematuria, pelvic pain, abd pain. Pt also requests pregnancy test as she has had intermittent mild nausea and increased appetite. LMP in December w neg pregnancy test in January.       Past Medical History:  Diagnosis Date  . Seasonal allergies     There are no active problems to display for this patient.   No past surgical history on file.  OB History    Gravida Para Term Preterm AB Living   0 0 0 0 0 0   SAB TAB Ectopic Multiple Live Births   0 0 0 0 0       Home Medications    Prior to Admission medications   Medication Sig Start Date End Date Taking? Authorizing Provider  metroNIDAZOLE (FLAGYL) 500 MG tablet Take 1 tablet (500 mg total) by mouth 2 (two) times daily. 11/17/16   [place] N Russo, PA-C  nitrofurantoin, macrocrystal-monohydrate, (MACROBID) 100 MG capsule Take 1 capsule (100 mg total) by mouth 2 (two) times daily. 11/17/16   [place] N Russo, PA-C    Family History No family history on file.  Social History Social History  Substance Use Topics  . Smoking status: Current Some Day Smoker  . Smokeless tobacco: Never Used     Comment: did not smoke daily---social smoking  . Alcohol use No     Allergies   Antihistamines, chlorpheniramine-type   Review of Systems Review of Systems  Constitutional: Positive for appetite change (increased).  Gastrointestinal: Positive for nausea. Negative for vomiting.  Genitourinary: Positive for dysuria.  Negative for difficulty urinating, frequency, vaginal bleeding and vaginal discharge.  Skin: Negative for rash.     Physical Exam Updated Vital Signs BP 116/60 (BP Location: Right Arm)   Pulse 74   Temp 98.7 F (37.1 C) (Oral)   Resp 17   Ht  (1.702 m)   Wt 51.7 kg   LMP 07/19/2016   SpO2 100%   BMI 17.85 kg/m   Physical Exam  Constitutional: She appears well-developed and well-nourished.  HENT:  Head: Normocephalic and atraumatic.  Eyes: Conjunctivae are normal.  Pulmonary/Chest: Effort normal.  Abdominal: Soft. Bowel sounds are normal. She exhibits no distension. There is no tenderness.  Genitourinary:  Genitourinary Comments: Exam performed w chaperone present. External urethral opening edematous and firm. Mild TTP. No blood, lesions, not erythematous. White watery malodorous discharge in vaginal vault. Cervix is friable. No CMT, no adnexal tenderness.  Skin: No rash noted.  Psychiatric: She has a normal mood and affect. Her behavior is normal.  Nursing note and vitals reviewed.    ED Treatments / Results  Labs (all labs ordered are listed, but only abnormal results are displayed) Labs Reviewed  WET PREP, GENITAL - Abnormal; Notable for the following:       Result Value   Clue Cells Wet Prep HPF POC PRESENT (*)    WBC, Wet Prep HPF POC FEW (*)    All other  components within normal limits  URINALYSIS, ROUTINE W REFLEX MICROSCOPIC - Abnormal; Notable for the following:    Leukocytes, UA TRACE (*)    Bacteria, UA RARE (*)    Squamous Epithelial / LPF 0-5 (*)    All other components within normal limits  POC URINE PREG, ED  GC/CHLAMYDIA PROBE AMP ([place]) NOT AT [facility]    EKG  EKG Interpretation None       Radiology No results found.  Procedures Procedures (including critical care time)  Medications Ordered in ED Medications  sterile water (preservative free) injection (not administered)  cefTRIAXone (ROCEPHIN) injection 250 mg (250 mg  Intramuscular Given 11/17/16 1304)  azithromycin (ZITHROMAX) tablet 1,000 mg (1,000 mg Oral Given 11/17/16 1304)     Initial Impression / Assessment and Plan / ED Course  I have reviewed the triage vital signs and the nursing notes.  Pertinent labs & imaging results that were available during my care of the patient were reviewed by me and considered in my medical decision making (see chart for details).    Pt w STD exposure. Pelvic exam w edematous external urethra. Likely UTI causing urethral inflammation. Wet prep w clue cells and WBCs. Urine preg negative. Pt declined HIV and RPR testing.  Patient to be discharged with instructions to follow up with OBGYN. Discussed importance of using protection when sexually active. Pt understands that they have GC/Chlamydia cultures pending and that they will need to inform all sexual partners if results return positive. Pt not concerning for PID because hemodynamically stable and no cervical motion tenderness on pelvic exam. Pt has been treated prophylactically with azithromycin and rocephin due to pts history, pelvic exam, and wet prep with increased WBCs. Pt afebrile, nontoxic. Pt will be discharged w Macrobid for UTI and flagyl for BV. Pt has been advised to not drink alcohol while on this medication.  Discussed results, findings, treatment and follow up. Patient advised of return precautions. Patient verbalized understanding and agreed with plan.   Final Clinical Impressions(s) / ED Diagnoses   Final diagnoses:  STD exposure  Encounter for pregnancy test with result negative  Acute UTI    New Prescriptions New Prescriptions   METRONIDAZOLE (FLAGYL) 500 MG TABLET    Take 1 tablet (500 mg total) by mouth 2 (two) times daily.   NITROFURANTOIN, MACROCRYSTAL-MONOHYDRATE, (MACROBID) 100 MG CAPSULE    Take 1 capsule (100 mg total) by mouth 2 (two) times daily.     [place] N Russo, PA-C 11/17/16 1333    [place] N Russo, [place] 11/17/16 1402    Charlynne Pander, MD 11/17/16 [phone]

## 2016-11-17 NOTE — ED Triage Notes (Addendum)
PT reports LMP December, with increased appetite and nausea. Pt also reports painful urination. Denies irregular discharge but reports she wants an STD screening because "it burns when she pees." Reports 1 negative pregnancy test January 8th.

## 2016-11-17 NOTE — Discharge Instructions (Signed)
Please read the instructions below. Talk with your primary care provider about any new medications. Please schedule an appointment for follow up with your OBGYN or primary care. Finish your antibiotic (Flagyl/Metronidazole and Nitrofurantoin) as prescribed. Do not drink alcohol with this medication as it will cause vomiting. You will receive a call from the hospital if your test results come back positive. Avoid sexual activity until you know your test results. If your results come back positive, it is important that you inform all of your sexual partners. Return to the ER for new or worsening symptoms.

## 2016-11-18 LAB — GC/CHLAMYDIA PROBE AMP (~~LOC~~) NOT AT ARMC
Chlamydia: NEGATIVE
Neisseria Gonorrhea: NEGATIVE

## 2017-03-20 ENCOUNTER — Inpatient Hospital Stay (HOSPITAL_COMMUNITY)
Admission: AD | Admit: 2017-03-20 | Discharge: 2017-03-20 | Disposition: A | Discharge status: Home / Self Care | Payer: 59 | Source: Ambulatory Visit | Attending: Obstetrics & Gynecology | Admitting: Obstetrics & Gynecology

## 2017-03-20 ENCOUNTER — Encounter (HOSPITAL_COMMUNITY): Payer: Self-pay | Admitting: *Deleted

## 2017-03-20 DIAGNOSIS — N939 Abnormal uterine and vaginal bleeding, unspecified: Secondary | ICD-10-CM | POA: Insufficient documentation

## 2017-03-20 DIAGNOSIS — F17290 Nicotine dependence, other tobacco product, uncomplicated: Secondary | ICD-10-CM | POA: Insufficient documentation

## 2017-03-20 DIAGNOSIS — A599 Trichomoniasis, unspecified: Secondary | ICD-10-CM | POA: Diagnosis present

## 2017-03-20 DIAGNOSIS — R109 Unspecified abdominal pain: Secondary | ICD-10-CM | POA: Diagnosis present

## 2017-03-20 LAB — COMPREHENSIVE METABOLIC PANEL
ALBUMIN: 4.6 g/dL (ref 3.5–5.0)
ALT: 11 U/L — ABNORMAL LOW (ref 14–54)
ANION GAP: 10 (ref 5–15)
AST: 20 U/L (ref 15–41)
Alkaline Phosphatase: 67 U/L (ref 38–126)
BILIRUBIN TOTAL: 1.3 mg/dL — AB (ref 0.3–1.2)
BUN: 11 mg/dL (ref 6–20)
CO2: 22 mmol/L (ref 22–32)
Calcium: 9.6 mg/dL (ref 8.9–10.3)
Chloride: 106 mmol/L (ref 101–111)
Creatinine, Ser: 0.88 mg/dL (ref 0.44–1.00)
GFR calc non Af Amer: >60 mL/min (ref >60)
GLUCOSE: 79 mg/dL (ref 65–99)
POTASSIUM: 3.5 mmol/L (ref 3.5–5.1)
Sodium: 138 mmol/L (ref 135–145)
TOTAL PROTEIN: 8.3 g/dL — AB (ref 6.5–8.1)

## 2017-03-20 LAB — URINALYSIS, ROUTINE W REFLEX MICROSCOPIC
Bacteria, UA: NONE SEEN
Bilirubin Urine: NEGATIVE
Glucose, UA: NEGATIVE mg/dL
KETONES UR: NEGATIVE mg/dL
Nitrite: NEGATIVE
PH: 5 (ref 5.0–8.0)
Protein, ur: 30 mg/dL — AB
SPECIFIC GRAVITY, URINE: 1.015 (ref 1.005–1.030)

## 2017-03-20 LAB — CBC
HEMATOCRIT: 40.1 % (ref 36.0–46.0)
Hemoglobin: 14 g/dL (ref 12.0–15.0)
MCH: 32.4 pg (ref 26.0–34.0)
MCHC: 34.9 g/dL (ref 30.0–36.0)
MCV: 92.8 fL (ref 78.0–100.0)
Platelets: 289 10*3/uL (ref 150–400)
RBC: 4.32 MIL/uL (ref 3.87–5.11)
RDW: 13.3 % (ref 11.5–15.5)
WBC: 4.8 10*3/uL (ref 4.0–10.5)

## 2017-03-20 LAB — WET PREP, GENITAL
Clue Cells Wet Prep HPF POC: NONE SEEN
SPERM: NONE SEEN
YEAST WET PREP: NONE SEEN

## 2017-03-20 LAB — POCT PREGNANCY, URINE: Preg Test, Ur: NEGATIVE

## 2017-03-20 MED ORDER — METRONIDAZOLE 500 MG PO TABS
2000.0000 mg | ORAL_TABLET | Freq: Once | ORAL | Status: AC
  Administered 2017-03-20: 2000 mg via ORAL
  Filled 2017-03-20: qty 4

## 2017-03-20 MED ORDER — METOCLOPRAMIDE HCL 10 MG PO TABS
10.0000 mg | ORAL_TABLET | Freq: Once | ORAL | Status: AC
  Administered 2017-03-20: 10 mg via ORAL
  Filled 2017-03-20: qty 1

## 2017-03-20 MED ORDER — KETOROLAC TROMETHAMINE 60 MG/2ML IM SOLN
60.0000 mg | INTRAMUSCULAR | Status: AC
  Administered 2017-03-20: 60 mg via INTRAMUSCULAR
  Filled 2017-03-20: qty 2

## 2017-03-20 NOTE — MAU Provider Note (Signed)
History     CSN: [id number]  Arrival date and time: 03/20/17 1223   First Provider Initiated Contact with Patient 03/20/17 1316      Chief Complaint  Patient presents with  . Abdominal Pain  . Vaginal Bleeding   HPI  Ms. Pamela Sharp is a 21 yo G0 non-pregnant female presenitng to MAU with complaints of cramping, spotting, nausea, and the inability to "smoke her Black & Mild cigars".  She states she has regular periods and her next period is due 8/14.  She wants to know why she is cramping and bleeding now.  She is sexually active with 1 female partner x 1 month.  She is not on any birth control and do not use condoms to protect against STIs and pregnancy.  She spoke to her aunt who told her to come for evaluation, because her "bleeding may be implantation bleeding".  Her aunt got this information from [employer].  Past Medical History:  Diagnosis Date  . Seasonal allergies     Past Surgical History:  Procedure Laterality Date  . NO PAST SURGERIES      History reviewed. No pertinent family history.  Social History  Substance Use Topics  . Smoking status: Current Some Day Smoker    Types: Cigars  . Smokeless tobacco: Never Used     Comment: did not smoke daily---social smoking  . Alcohol use 0.0 oz/week     Comment: on special occasions    Allergies:  Allergies  Allergen Reactions  . Antihistamines, Chlorpheniramine-Type     Facility-Administered Medications Prior to Admission  Medication Dose Route Frequency Provider Last Rate Last Dose  . ibuprofen (ADVIL,MOTRIN) tablet 500 mg  500 mg Oral Once Ethelda Chick, MD       Prescriptions Prior to Admission  Medication Sig Dispense Refill Last Dose  . metroNIDAZOLE (FLAGYL) 500 MG tablet Take 1 tablet (500 mg total) by mouth 2 (two) times daily. 14 tablet 0   . nitrofurantoin, macrocrystal-monohydrate, (MACROBID) 100 MG capsule Take 1 capsule (100 mg total) by mouth 2 (two) times daily. 10 capsule 0     Review of  Systems  Constitutional: Negative.   HENT: Negative.   Eyes: Negative.   Respiratory: Negative.   Cardiovascular: Negative.   Gastrointestinal: Positive for abdominal pain and nausea. Negative for vomiting.  Endocrine: Negative.   Genitourinary: Positive for vaginal bleeding.  Musculoskeletal: Negative.   Skin: Negative.   Allergic/Immunologic: Negative.   Neurological: Negative.   Hematological: Negative.   Psychiatric/Behavioral: Negative.    Physical Exam   Blood pressure 102/79, pulse (!) 107, temperature 98.1 F (36.7 C), temperature source Oral, resp. rate 18, height 5\' 7"  (1.702 m), weight 48.5 kg (107 lb), last menstrual period 02/21/2017.  Physical Exam  Constitutional: She is oriented to person, place, and time. She appears well-developed and well-nourished.  HENT:  Head: Normocephalic.  Eyes: Pupils are equal, round, and reactive to light.  Neck: Normal range of motion.  Cardiovascular: Normal rate, regular rhythm and normal heart sounds.   Respiratory: Effort normal and breath sounds normal.  GI: Soft. Bowel sounds are normal.  Genitourinary:  Genitourinary Comments: Uterus: non-tender, cx; smooth, pink, no lesions, small amt of dark, red blood in posterior fornix, closed/long/firm, no CMT or friability, no adnexal tenderness  Musculoskeletal: Normal range of motion.  Neurological: She is alert and oriented to person, place, and time.  Skin: Skin is warm and dry.  Psychiatric: She has a normal mood and affect. Her behavior  is normal. Judgment and thought content normal.    MAU Course  Procedures  MDM CCUA UPT Wet Prep GC/CT HIV CBC CMP Reglan 10 mg PO -- nausea improved Toradol 60 mg IM -- pain improved  Results for orders placed or performed during the hospital encounter of 03/20/17 (from the past 24 hour(s))  Urinalysis, Routine w reflex microscopic     Status: Abnormal   Collection Time: 03/20/17 12:23 PM  Result Value Ref Range   Color, Urine  YELLOW YELLOW   APPearance CLEAR CLEAR   Specific Gravity, Urine 1.015 1.005 - 1.030   pH 5.0 5.0 - 8.0   Glucose, UA NEGATIVE NEGATIVE mg/dL   Hgb urine dipstick LARGE (A) NEGATIVE   Bilirubin Urine NEGATIVE NEGATIVE   Ketones, ur NEGATIVE NEGATIVE mg/dL   Protein, ur 30 (A) NEGATIVE mg/dL   Nitrite NEGATIVE NEGATIVE   Leukocytes, UA TRACE (A) NEGATIVE   RBC / HPF TOO NUMEROUS TO COUNT 0 - 5 RBC/hpf   WBC, UA 6-30 0 - 5 WBC/hpf   Bacteria, UA NONE SEEN NONE SEEN   Squamous Epithelial / LPF 0-5 (A) NONE SEEN   Mucous PRESENT   Pregnancy, urine POC     Status: None   Collection Time: 03/20/17 12:47 PM  Result Value Ref Range   Preg Test, Ur NEGATIVE NEGATIVE  Wet prep, genital     Status: Abnormal   Collection Time: 03/20/17  1:36 PM  Result Value Ref Range   Yeast Wet Prep HPF POC NONE SEEN NONE SEEN   Trich, Wet Prep PRESENT (A) NONE SEEN   Clue Cells Wet Prep HPF POC NONE SEEN NONE SEEN   WBC, Wet Prep HPF POC FEW (A) NONE SEEN   Sperm NONE SEEN    Assessment and Plan  Trichomoniasis - Treat with Flagyl 2000 mg po now - Instructions on Trichomoniasis - Partner incarcerated -- unable to get Rx to him / advised to contact him to notify him the need for tx  Discharge home  Patient verbalized an understanding of the plan of care and agrees.   Raelyn Moraolitta Solan Vosler, MSN, CNM 03/20/2017, 1:30 PM

## 2017-03-20 NOTE — MAU Note (Signed)
Pt reports stared having some spotting and cramping. Period not due until 8/14. Pt reports she is having some Nausea. "can't smoke her black and milds because it makes her sick to her stomach."

## 2017-03-21 LAB — HIV ANTIBODY (ROUTINE TESTING W REFLEX): HIV SCREEN 4TH GENERATION: NONREACTIVE

## 2017-03-24 LAB — GC/CHLAMYDIA PROBE AMP (~~LOC~~) NOT AT ARMC
CHLAMYDIA, DNA PROBE: NEGATIVE
NEISSERIA GONORRHEA: POSITIVE — AB

## 2017-03-29 ENCOUNTER — Emergency Department (HOSPITAL_COMMUNITY)
Admission: EM | Admit: 2017-03-29 | Discharge: 2017-03-29 | Disposition: A | Discharge status: Home / Self Care | Payer: 59 | Attending: Emergency Medicine | Admitting: Emergency Medicine

## 2017-03-29 NOTE — ED Notes (Signed)
Pt left without being seen before triage.

## 2017-04-27 ENCOUNTER — Emergency Department (HOSPITAL_COMMUNITY)
Admission: EM | Admit: 2017-04-27 | Discharge: 2017-04-27 | Disposition: A | Discharge status: Home / Self Care | Payer: 59 | Attending: Emergency Medicine | Admitting: Emergency Medicine

## 2017-04-27 ENCOUNTER — Encounter (HOSPITAL_COMMUNITY): Payer: Self-pay | Admitting: Emergency Medicine

## 2017-04-27 DIAGNOSIS — N760 Acute vaginitis: Secondary | ICD-10-CM | POA: Insufficient documentation

## 2017-04-27 DIAGNOSIS — R110 Nausea: Secondary | ICD-10-CM

## 2017-04-27 DIAGNOSIS — R1084 Generalized abdominal pain: Secondary | ICD-10-CM

## 2017-04-27 DIAGNOSIS — B9689 Other specified bacterial agents as the cause of diseases classified elsewhere: Secondary | ICD-10-CM

## 2017-04-27 DIAGNOSIS — N938 Other specified abnormal uterine and vaginal bleeding: Secondary | ICD-10-CM | POA: Diagnosis not present

## 2017-04-27 LAB — WET PREP, GENITAL
Sperm: NONE SEEN
Trich, Wet Prep: NONE SEEN
YEAST WET PREP: NONE SEEN

## 2017-04-27 LAB — COMPREHENSIVE METABOLIC PANEL
ALK PHOS: 60 U/L (ref 38–126)
ALT: 12 U/L — AB (ref 14–54)
ANION GAP: 9 (ref 5–15)
AST: 24 U/L (ref 15–41)
Albumin: 4.4 g/dL (ref 3.5–5.0)
BUN: 10 mg/dL (ref 6–20)
CALCIUM: 9.2 mg/dL (ref 8.9–10.3)
CO2: 23 mmol/L (ref 22–32)
CREATININE: 0.87 mg/dL (ref 0.44–1.00)
Chloride: 107 mmol/L (ref 101–111)
Glucose, Bld: 90 mg/dL (ref 65–99)
Potassium: 3.5 mmol/L (ref 3.5–5.1)
SODIUM: 139 mmol/L (ref 135–145)
TOTAL PROTEIN: 7.9 g/dL (ref 6.5–8.1)
Total Bilirubin: 1 mg/dL (ref 0.3–1.2)

## 2017-04-27 LAB — URINALYSIS, ROUTINE W REFLEX MICROSCOPIC
BILIRUBIN URINE: NEGATIVE
Bacteria, UA: NONE SEEN
GLUCOSE, UA: NEGATIVE mg/dL
KETONES UR: NEGATIVE mg/dL
LEUKOCYTES UA: NEGATIVE
NITRITE: NEGATIVE
PH: 6 (ref 5.0–8.0)
PROTEIN: NEGATIVE mg/dL
RBC / HPF: NONE SEEN RBC/hpf (ref 0–5)
Specific Gravity, Urine: 1.01 (ref 1.005–1.030)

## 2017-04-27 LAB — CBC
HCT: 37.6 % (ref 36.0–46.0)
HEMOGLOBIN: 12.7 g/dL (ref 12.0–15.0)
MCH: 31.6 pg (ref 26.0–34.0)
MCHC: 33.8 g/dL (ref 30.0–36.0)
MCV: 93.5 fL (ref 78.0–100.0)
PLATELETS: 279 10*3/uL (ref 150–400)
RBC: 4.02 MIL/uL (ref 3.87–5.11)
RDW: 13.5 % (ref 11.5–15.5)
WBC: 4.5 10*3/uL (ref 4.0–10.5)

## 2017-04-27 LAB — I-STAT BETA HCG BLOOD, ED (MC, WL, AP ONLY): I-stat hCG, quantitative: <5 m[IU]/mL (ref <5)

## 2017-04-27 LAB — LIPASE, BLOOD: Lipase: 30 U/L (ref 11–51)

## 2017-04-27 MED ORDER — METRONIDAZOLE 500 MG PO TABS
500.0000 mg | ORAL_TABLET | Freq: Once | ORAL | Status: AC
  Administered 2017-04-27: 500 mg via ORAL
  Filled 2017-04-27: qty 1

## 2017-04-27 MED ORDER — IBUPROFEN 600 MG PO TABS: 600.0000 mg | ORAL_TABLET | Freq: Four times a day (QID) | ORAL | 0 refills | Status: DC | PRN

## 2017-04-27 MED ORDER — KETOROLAC TROMETHAMINE 30 MG/ML IJ SOLN
30.0000 mg | Freq: Once | INTRAMUSCULAR | Status: AC
  Administered 2017-04-27: 30 mg via INTRAVENOUS
  Filled 2017-04-27: qty 1

## 2017-04-27 MED ORDER — ONDANSETRON HCL 4 MG/2ML IJ SOLN
4.0000 mg | Freq: Once | INTRAMUSCULAR | Status: AC
  Administered 2017-04-27: 4 mg via INTRAVENOUS
  Filled 2017-04-27: qty 2

## 2017-04-27 MED ORDER — METRONIDAZOLE 500 MG PO TABS: 500.0000 mg | ORAL_TABLET | Freq: Two times a day (BID) | ORAL | 0 refills | Status: DC

## 2017-04-27 MED ORDER — ONDANSETRON 4 MG PO TBDP: 4.0000 mg | ORAL_TABLET | Freq: Three times a day (TID) | ORAL | 0 refills | Status: DC | PRN

## 2017-04-27 MED ORDER — SODIUM CHLORIDE 0.9 % IV BOLUS (SEPSIS)
1000.0000 mL | Freq: Once | INTRAVENOUS | Status: AC
  Administered 2017-04-27: 1000 mL via INTRAVENOUS

## 2017-04-27 NOTE — ED Provider Notes (Signed)
[facility]-EMERGENCY DEPT Provider Note   CSN: [id number] Arrival date & time: 04/27/17  8295     History   Chief Complaint Chief Complaint  Patient presents with  . Abdominal Pain  . Vaginal Bleeding    HPI Pamela Sharp is a 21 y.o. female.  Pt presents to the ED today with abdominal pain and vaginal bleeding.  Pt has not had a regular period in 3 months, but has been spotting.  The pt took a pregnancy test 2 months ago and it was negative.  The pt also c/o nausea.  She said her lower abdomen tightens, but then releases periodically.      Past Medical History:  Diagnosis Date  . Seasonal allergies     Patient Active Problem List   Diagnosis Date Noted  . Trichimoniasis 03/20/2017    Past Surgical History:  Procedure Laterality Date  . NO PAST SURGERIES      OB History    Gravida Para Term Preterm AB Living   0 0 0 0 0 0   SAB TAB Ectopic Multiple Live Births   0 0 0 0 0       Home Medications    Prior to Admission medications   Medication Sig Start Date End Date Taking? Authorizing Provider  ibuprofen (ADVIL,MOTRIN) 600 MG tablet Take 1 tablet (600 mg total) by mouth every 6 (six) hours as needed. 04/27/17   Jacalyn Lefevre, MD  metroNIDAZOLE (FLAGYL) 500 MG tablet Take 1 tablet (500 mg total) by mouth 2 (two) times daily. 04/27/17   Jacalyn Lefevre, MD  ondansetron (ZOFRAN ODT) 4 MG disintegrating tablet Take 1 tablet (4 mg total) by mouth every 8 (eight) hours as needed. 04/27/17   Jacalyn Lefevre, MD    Family History History reviewed. No pertinent family history.  Social History Social History  Substance Use Topics  . Smoking status: Current Some Day Smoker    Types: Cigars  . Smokeless tobacco: Never Used     Comment: did not smoke daily---social smoking  . Alcohol use 0.0 oz/week     Comment: on special occasions     Allergies   Antihistamines, chlorpheniramine-type   Review of Systems Review of Systems  Gastrointestinal: Positive for  abdominal pain and nausea.  Genitourinary: Positive for vaginal bleeding.  All other systems reviewed and are negative.    Physical Exam Updated Vital Signs BP 112/68 (BP Location: Right Arm)   Pulse 78   Temp 98.2 F (36.8 C) (Oral)   Resp 13   Ht  (1.702 m)   Wt 49.9 kg (110 lb)   LMP 02/24/2017   SpO2 100%   BMI 17.23 kg/m   Physical Exam  Constitutional: She is oriented to person, place, and time. She appears well-developed and well-nourished.  HENT:  Head: Normocephalic and atraumatic.  Right Ear: External ear normal.  Left Ear: External ear normal.  Nose: Nose normal.  Mouth/Throat: Oropharynx is clear and moist.  Eyes: Pupils are equal, round, and reactive to light. Conjunctivae and EOM are normal.  Neck: Normal range of motion. Neck supple.  Cardiovascular: Normal rate, regular rhythm, normal heart sounds and intact distal pulses.   Pulmonary/Chest: Effort normal and breath sounds normal.  Abdominal: Soft. Bowel sounds are normal.  Genitourinary: Right adnexum displays no tenderness. Left adnexum displays no tenderness. There is bleeding in the vagina.  Musculoskeletal: Normal range of motion.  Neurological: She is alert and oriented to person, place, and time.  Skin: Skin is  warm.  Psychiatric: She has a normal mood and affect. Her behavior is normal. Judgment and thought content normal.  Nursing note and vitals reviewed.    ED Treatments / Results  Labs (all labs ordered are listed, but only abnormal results are displayed) Labs Reviewed  WET PREP, GENITAL - Abnormal; Notable for the following:       Result Value   Clue Cells Wet Prep HPF POC PRESENT (*)    WBC, Wet Prep HPF POC MODERATE (*)    All other components within normal limits  COMPREHENSIVE METABOLIC PANEL - Abnormal; Notable for the following:    ALT 12 (*)    All other components within normal limits  URINALYSIS, ROUTINE W REFLEX MICROSCOPIC - Abnormal; Notable for the following:    Hgb  urine dipstick MODERATE (*)    Squamous Epithelial / LPF 0-5 (*)    All other components within normal limits  LIPASE, BLOOD  CBC  I-STAT BETA HCG BLOOD, ED ([facility], [facility], [facility] ONLY)  GC/CHLAMYDIA PROBE AMP ([place]) NOT AT [facility]    EKG  EKG Interpretation None       Radiology No results found.  Procedures Procedures (including critical care time)  Medications Ordered in ED Medications  metroNIDAZOLE (FLAGYL) tablet 500 mg (not administered)  sodium chloride 0.9 % bolus 1,000 mL (0 mLs Intravenous Stopped 04/27/17 1127)  ondansetron (ZOFRAN) injection 4 mg (4 mg Intravenous Given 04/27/17 1017)  ketorolac (TORADOL) 30 MG/ML injection 30 mg (30 mg Intravenous Given 04/27/17 1128)     Initial Impression / Assessment and Plan / ED Course  I have reviewed the triage vital signs and the nursing notes.  Pertinent labs & imaging results that were available during my care of the patient were reviewed by me and considered in my medical decision making (see chart for details).    Pt is feeling better.  She is not pregnant.  She does have BV which is the likely cause of her cramping.  She is also on her period which is likely causing cramping.  Pt instructed to f/u with women's clinic.  Final Clinical Impressions(s) / ED Diagnoses   Final diagnoses:  DUB (dysfunctional uterine bleeding)  Generalized abdominal pain  BV (bacterial vaginosis)  Nausea    New Prescriptions New Prescriptions   IBUPROFEN (ADVIL,MOTRIN) 600 MG TABLET    Take 1 tablet (600 mg total) by mouth every 6 (six) hours as needed.   METRONIDAZOLE (FLAGYL) 500 MG TABLET    Take 1 tablet (500 mg total) by mouth 2 (two) times daily.   ONDANSETRON (ZOFRAN ODT) 4 MG DISINTEGRATING TABLET    Take 1 tablet (4 mg total) by mouth every 8 (eight) hours as needed.     Jacalyn Lefevre, MD 04/27/17 1213

## 2017-04-27 NOTE — ED Triage Notes (Signed)
Patient complaining of vaginal spotting for three months. Patient states she has not had a regular menstrual cycle for them three months. Patient states that she is also having lower abdominal pain for three months. Patient states the pain is so bad she can not sleep at night. Patient is requesting to have an ultrasound.

## 2017-04-27 NOTE — ED Notes (Signed)
ED Provider at bedside. 

## 2017-04-27 NOTE — ED Notes (Signed)
U/A collected in triage. ENM 

## 2017-04-28 LAB — GC/CHLAMYDIA PROBE AMP (~~LOC~~) NOT AT ARMC
CHLAMYDIA, DNA PROBE: NEGATIVE
Neisseria Gonorrhea: NEGATIVE

## 2017-05-24 ENCOUNTER — Inpatient Hospital Stay (HOSPITAL_COMMUNITY)
Admission: AD | Admit: 2017-05-24 | Discharge: 2017-05-24 | Discharge status: Against Medical Advice | Payer: 59 | Source: Ambulatory Visit | Attending: Obstetrics and Gynecology | Admitting: Obstetrics and Gynecology

## 2017-05-24 ENCOUNTER — Encounter (HOSPITAL_COMMUNITY): Payer: Self-pay | Admitting: *Deleted

## 2017-05-24 DIAGNOSIS — M549 Dorsalgia, unspecified: Secondary | ICD-10-CM | POA: Diagnosis present

## 2017-05-24 LAB — URINALYSIS, ROUTINE W REFLEX MICROSCOPIC
Bilirubin Urine: NEGATIVE
GLUCOSE, UA: NEGATIVE mg/dL
Hgb urine dipstick: NEGATIVE
KETONES UR: NEGATIVE mg/dL
LEUKOCYTES UA: NEGATIVE
NITRITE: NEGATIVE
PROTEIN: NEGATIVE mg/dL
Specific Gravity, Urine: 1.021 (ref 1.005–1.030)
pH: 5 (ref 5.0–8.0)

## 2017-05-24 LAB — POCT PREGNANCY, URINE: PREG TEST UR: NEGATIVE

## 2017-05-24 LAB — HCG, SERUM, QUALITATIVE: PREG SERUM: NEGATIVE

## 2017-05-24 NOTE — Progress Notes (Signed)
Dr Cherly Hensen notified pt left AMA

## 2017-05-24 NOTE — MAU Note (Addendum)
I cannot sleep at night because I have back soreness. My side hurt and my stomach jumps all day and makes it hard to sleep. UPTs negative but my aunt still thinks I am. I had BM today but am constipated

## 2017-05-24 NOTE — MAU Note (Signed)
Pt went to [place], admission personnel, and told her she was tired of waiting and was leaving. Pt had already had some lab work done and was waiting for room. Pt was cussing at admission staff as she walked out of MAU. Called pt about later to be sure she had not returned and she was in lobby

## 2017-06-12 ENCOUNTER — Other Ambulatory Visit: Payer: Self-pay | Admitting: Obstetrics and Gynecology

## 2017-10-16 ENCOUNTER — Ambulatory Visit: Payer: 59 | Admitting: Emergency Medicine

## 2022-07-25 ENCOUNTER — Telehealth: Payer: 59

## 2022-12-13 LAB — OB RESULTS CONSOLE GC/CHLAMYDIA: Neisseria Gonorrhea: NEGATIVE

## 2024-02-12 LAB — OB RESULTS CONSOLE ABO/RH: RH Type: POSITIVE

## 2024-02-12 LAB — OB RESULTS CONSOLE ANTIBODY SCREEN: Antibody Screen: NEGATIVE

## 2024-04-08 ENCOUNTER — Telehealth: Payer: Self-pay | Admitting: *Deleted

## 2024-04-08 DIAGNOSIS — Z3689 Encounter for other specified antenatal screening: Secondary | ICD-10-CM | POA: Diagnosis not present

## 2024-04-08 DIAGNOSIS — Z3490 Encounter for supervision of normal pregnancy, unspecified, unspecified trimester: Secondary | ICD-10-CM | POA: Insufficient documentation

## 2024-04-08 DIAGNOSIS — O0993 Supervision of high risk pregnancy, unspecified, third trimester: Secondary | ICD-10-CM | POA: Insufficient documentation

## 2024-04-08 DIAGNOSIS — A6000 Herpesviral infection of urogenital system, unspecified: Secondary | ICD-10-CM | POA: Insufficient documentation

## 2024-04-08 MED ORDER — BLOOD PRESSURE KIT DEVI: 1.0000 | 0 refills | Status: AC | PRN

## 2024-04-08 MED ORDER — GOJJI WEIGHT SCALE MISC: 1.0000 | 0 refills | Status: AC

## 2024-04-08 NOTE — Patient Instructions (Signed)

## 2024-04-08 NOTE — Progress Notes (Signed)
 New OB Intake  I connected with Pamela Sharp  on 04/08/24 at  9:15 AM EDT by MyChart Video Visit and verified that I am speaking with the correct person using two identifiers. Nurse is located at [facility] and pt is located at home.  I discussed the limitations, risks, security and privacy concerns of performing an evaluation and management service by telephone and the availability of in person appointments. I also discussed with the patient that there may be a patient responsible charge related to this service. The patient expressed understanding and agreed to proceed.  I explained I am completing New OB Intake today. We discussed EDD of 10/20/24 based on LMP of 01/14/24 consistent with US  done on 03/10/24. Pt is G1P0000. I reviewed her allergies, medications and Medical/Surgical/OB history.    Patient Active Problem List   Diagnosis Date Noted   Supervision of low-risk pregnancy 04/08/2024   Genital herpes 04/08/2024     Concerns addressed today  Delivery Plans Plans to deliver at [facility] Discussed the nature of our practice with multiple providers including residents and students as well as female and female providers. Due to the size of the practice, the delivering provider may not be the same as those providing prenatal care.   Patient is interested in water  birth.  MyChart/Babyscripts MyChart access verified. I explained pt will have some visits in office and some virtually. Babyscripts instructions given and order placed.   Blood Pressure Cuff/Weight Scale Blood pressure cuff ordered for patient to pick-up from [employer]. Explained after first prenatal appt pt will check weekly and document in Babyscripts. Patient does not have weight scale; order sent to [facility], patient may track weight weekly in Babyscripts.  Anatomy US  Explained first scheduled US  will be around 19 weeks. Anatomy US  scheduled for 06/04/24 at 2:00.  Is patient a CenteringPregnancy candidate?   Accepted   Is patient a Mom+Baby Combined Care candidate?  Declined    Is patient a candidate for Babyscripts Optimization? Yes, but is centering.    First visit review I reviewed new OB appt with patient. Explained pt will be seen by Dr. Eveline  at first visit. Discussed [place] genetic screening with patient. She would like both  Panorama and Horizon drawn with routine prenatal labs at new ob visit.    Last Pap No results found for: EDMON Rock Skip OBIE 04/08/2024  11:35 AM

## 2024-04-22 ENCOUNTER — Other Ambulatory Visit (HOSPITAL_COMMUNITY)
Admission: RE | Admit: 2024-04-22 | Discharge: 2024-04-22 | Disposition: A | Discharge status: Home / Self Care | Source: Ambulatory Visit | Attending: Obstetrics & Gynecology | Admitting: Obstetrics & Gynecology

## 2024-04-22 ENCOUNTER — Ambulatory Visit: Payer: Self-pay | Admitting: Obstetrics & Gynecology

## 2024-04-22 ENCOUNTER — Other Ambulatory Visit: Payer: Self-pay

## 2024-04-22 VITALS — BP 97/64 | HR 93 | Wt 123.0 lb

## 2024-04-22 DIAGNOSIS — Z31430 Encounter of female for testing for genetic disease carrier status for procreative management: Secondary | ICD-10-CM | POA: Insufficient documentation

## 2024-04-22 DIAGNOSIS — Z3493 Encounter for supervision of normal pregnancy, unspecified, third trimester: Secondary | ICD-10-CM | POA: Diagnosis present

## 2024-04-22 DIAGNOSIS — Z3A14 14 weeks gestation of pregnancy: Secondary | ICD-10-CM | POA: Insufficient documentation

## 2024-04-22 DIAGNOSIS — Z3490 Encounter for supervision of normal pregnancy, unspecified, unspecified trimester: Secondary | ICD-10-CM

## 2024-04-22 DIAGNOSIS — Z3492 Encounter for supervision of normal pregnancy, unspecified, second trimester: Secondary | ICD-10-CM | POA: Diagnosis not present

## 2024-04-22 NOTE — Progress Notes (Signed)
  Subjective:NOB    Pamela Sharp is a G1P0000 [redacted]w[redacted]d being seen today for her first obstetrical visit.  Her obstetrical history is significant for first pregnancy. Patient does intend to breast feed. Pregnancy history fully reviewed.  Patient reports nausea and vomiting.  Vitals:   04/22/24 1415  BP: 97/64  Pulse: 93  Weight: 123 lb (55.8 kg)    HISTORY: OB History  Gravida Para Term Preterm AB Living  1 0 0 0 0 0  SAB IAB Ectopic Multiple Live Births  0 0 0 0 0    # Outcome Date GA Lbr Len/2nd Weight Sex Type Anes PTL Lv  1 Current            Past Medical History:  Diagnosis Date   Asthma    Herpes genitalis in women    Seasonal allergies    Past Surgical History:  Procedure Laterality Date   NO PAST SURGERIES     Family History  Problem Relation Age of Onset   Asthma Mother    Pancreatic disease Mother    Diabetes Maternal Grandmother    Heart disease Paternal Grandmother      Exam    Uterus:     Pelvic Exam:    Perineum: No Hemorrhoids   Vulva: normal   Vagina:  normal mucosa   pH:    Cervix: no lesions   Adnexa: normal adnexa   Bony Pelvis:   System: Breast:  Inspection negative   Skin: normal coloration and turgor, no rashes    Neurologic: oriented, normal mood   Extremities: normal strength, tone, and muscle mass   HEENT PERRLA   Mouth/Teeth mucous membranes moist, pharynx normal without lesions   Neck supple   Cardiovascular: regular rate and rhythm   Respiratory:  appears well, vitals normal, no respiratory distress, acyanotic, normal RR, chest clear, no wheezing, crepitations, rhonchi, normal symmetric air entry   Abdomen: soft, non-tender; bowel sounds normal; no masses,  no organomegaly   Urinary: urethral meatus normal      Assessment:    Pregnancy: G1P0000 Patient Active Problem List   Diagnosis Date Noted   Supervision of low-risk pregnancy 04/08/2024   Genital herpes 04/08/2024        Plan:     Initial labs  drawn. Prenatal vitamins. Problem list reviewed and updated. Genetic Screening discussed : ordered.  Ultrasound discussed; fetal survey: ordered.  Follow up in 4 weeks. 50% of 30 min visit spent on counseling and coordination of care.  Exam done by Dr. CHARLENA Pouch per patient request for female examiner and we discussed the results   Lynwood Solomons 04/22/2024

## 2024-04-23 LAB — CBC/D/PLT+RPR+RH+ABO+RUBIGG...
Antibody Screen: NEGATIVE
Basophils Absolute: 0 x10E3/uL (ref 0.0–0.2)
Basos: 0 %
EOS (ABSOLUTE): 0.1 x10E3/uL (ref 0.0–0.4)
Eos: 2 %
HCV Ab: NONREACTIVE
HIV Screen 4th Generation wRfx: NONREACTIVE
Hematocrit: 33.7 % — ABNORMAL LOW (ref 34.0–46.6)
Hemoglobin: 11.2 g/dL (ref 11.1–15.9)
Hepatitis B Surface Ag: NEGATIVE
Immature Grans (Abs): 0 x10E3/uL (ref 0.0–0.1)
Immature Granulocytes: 0 %
Lymphocytes Absolute: 2.6 x10E3/uL (ref 0.7–3.1)
Lymphs: 37 %
MCH: 31.9 pg (ref 26.6–33.0)
MCHC: 33.2 g/dL (ref 31.5–35.7)
MCV: 96 fL (ref 79–97)
Monocytes Absolute: 0.7 x10E3/uL (ref 0.1–0.9)
Monocytes: 10 %
Neutrophils Absolute: 3.6 x10E3/uL (ref 1.4–7.0)
Neutrophils: 51 %
Platelets: 272 x10E3/uL (ref 150–450)
RBC: 3.51 x10E6/uL — ABNORMAL LOW (ref 3.77–5.28)
RDW: 12.5 % (ref 11.7–15.4)
RPR Ser Ql: NONREACTIVE
Rh Factor: POSITIVE
Rubella Antibodies, IGG: 1.34 {index} (ref >0.99)
WBC: 7.1 x10E3/uL (ref 3.4–10.8)

## 2024-04-23 LAB — CYTOLOGY - PAP
Chlamydia: NEGATIVE
Comment: NEGATIVE
Comment: NORMAL
Diagnosis: NEGATIVE
Neisseria Gonorrhea: NEGATIVE

## 2024-04-23 LAB — HEMOGLOBIN A1C
Est. average glucose Bld gHb Est-mCnc: 105 mg/dL
Hgb A1c MFr Bld: 5.3 % (ref 4.8–5.6)

## 2024-04-23 LAB — HCV INTERPRETATION

## 2024-04-24 LAB — CULTURE, OB URINE

## 2024-04-24 LAB — URINE CULTURE, OB REFLEX

## 2024-04-26 DIAGNOSIS — Z31430 Encounter of female for testing for genetic disease carrier status for procreative management: Secondary | ICD-10-CM | POA: Insufficient documentation

## 2024-04-26 DIAGNOSIS — Z3A14 14 weeks gestation of pregnancy: Secondary | ICD-10-CM | POA: Insufficient documentation

## 2024-04-30 LAB — PANORAMA PRENATAL TEST FULL PANEL:PANORAMA TEST PLUS 5 ADDITIONAL MICRODELETIONS: FETAL FRACTION: 14.9

## 2024-05-03 LAB — HORIZON CUSTOM: REPORT SUMMARY: NEGATIVE

## 2024-05-03 NOTE — Progress Notes (Signed)
 This encounter was created in error - please disregard.

## 2024-05-20 ENCOUNTER — Encounter: Admitting: Obstetrics and Gynecology

## 2024-05-20 ENCOUNTER — Ambulatory Visit (INDEPENDENT_AMBULATORY_CARE_PROVIDER_SITE_OTHER): Admitting: Advanced Practice Midwife

## 2024-05-20 ENCOUNTER — Other Ambulatory Visit: Payer: Self-pay

## 2024-05-20 VITALS — BP 102/66 | HR 79 | Wt 131.0 lb

## 2024-05-20 DIAGNOSIS — Z3A18 18 weeks gestation of pregnancy: Secondary | ICD-10-CM | POA: Diagnosis not present

## 2024-05-20 DIAGNOSIS — Z3492 Encounter for supervision of normal pregnancy, unspecified, second trimester: Secondary | ICD-10-CM

## 2024-05-20 DIAGNOSIS — A6004 Herpesviral vulvovaginitis: Secondary | ICD-10-CM | POA: Diagnosis not present

## 2024-05-20 DIAGNOSIS — Z0289 Encounter for other administrative examinations: Secondary | ICD-10-CM

## 2024-05-20 DIAGNOSIS — Z3493 Encounter for supervision of normal pregnancy, unspecified, third trimester: Secondary | ICD-10-CM

## 2024-05-20 MED ORDER — VALACYCLOVIR HCL 500 MG PO TABS: 500.0000 mg | ORAL_TABLET | Freq: Two times a day (BID) | ORAL | 6 refills | Status: DC

## 2024-05-20 NOTE — Progress Notes (Signed)
    PRENATAL VISIT NOTE  Subjective:  Pamela Sharp is a 28 y.o. G1P0000 at [redacted]w[redacted]d being seen today for ongoing prenatal care.  She is currently monitored for the following issues for this low-risk pregnancy and has Supervision of low-risk pregnancy; Genital herpes; Encounter of female for testing for genetic disease carrier status for procreative management; and [redacted] weeks gestation of pregnancy on their problem list.   Patient reports no complaints.  Contractions: Not present. Vag. Bleeding: None.  Movement: Present. Denies leaking of fluid.   The following portions of the patient's history were reviewed and updated as appropriate: allergies, current medications, past family history, past medical history, past social history, past surgical history and problem list.   Objective:   Vitals:   05/20/24 1516  BP: 102/66  Pulse: 79  Weight: 131 lb (59.4 kg)    Fetal Status: Fetal Heart Rate (bpm): 154   Movement: Present     General:  Alert, oriented and cooperative. Patient is in no acute distress.  Skin: Skin is warm and dry. No rash noted.   Cardiovascular: Normal heart rate noted  Respiratory: Normal respiratory effort, no problems with respiration noted  Abdomen: Soft, gravid, appropriate for gestational age.  Pain/Pressure: Absent     Pelvic: Cervical exam deferred        Extremities: Normal range of motion.  Edema: None  Mental Status: Normal mood and affect. Normal behavior. Normal judgment and thought content.   Assessment and Plan:  Pregnancy: G1P0000 at [redacted]w[redacted]d 1. Encounter for supervision of low-risk pregnancy in third trimester (Primary) - AFP, Serum, Open Spina Bifida  2. Herpes simplex vulvovaginitis - valACYclovir (VALTREX) 500 MG tablet; Take 1 tablet (500 mg total) by mouth 2 (two) times daily.  Dispense: 30 tablet; Refill: 6 - Discussed recommendation for HSV prophylaxis at 36 weeks.  Preterm labor symptoms and general obstetric precautions including but not  limited to vaginal bleeding, contractions, leaking of fluid and fetal movement were reviewed in detail with the patient. Please refer to After Visit Summary for other counseling recommendations.   No follow-ups on file.  Future Appointments  Date Time Provider Department Center  06/01/2024  9:00 AM CENTERING PROVIDER [facility]  06/04/2024  2:00 PM [facility] PROVIDER 1 [facility]  06/04/2024  2:30 PM [facility] US2 [facility]  06/29/2024  9:00 AM CENTERING PROVIDER [facility]  07/27/2024  9:00 AM CENTERING PROVIDER [facility]  08/10/2024  9:00 AM CENTERING PROVIDER [facility]  08/24/2024  9:00 AM CENTERING PROVIDER [facility]  09/07/2024  9:00 AM CENTERING PROVIDER [facility]  09/21/2024  9:00 AM CENTERING PROVIDER [facility]  10/05/2024  9:00 AM CENTERING PROVIDER [facility]  10/12/2024  9:00 AM CENTERING PROVIDER [facility]  10/19/2024  9:00 AM CENTERING PROVIDER [facility]    Pamela Sharp  Pamela Sharp, CNM [employer]

## 2024-05-22 LAB — AFP, SERUM, OPEN SPINA BIFIDA
AFP MoM: 1.23
AFP Value: 64.1 ng/mL
Gest. Age on Collection Date: 18.1 wk
Maternal Age At EDD: 29.2 a
OSBR Risk 1 IN: 10000
Test Results:: NEGATIVE
Weight: 131 [lb_av]

## 2024-05-25 ENCOUNTER — Ambulatory Visit: Payer: Self-pay | Admitting: Advanced Practice Midwife

## 2024-05-25 ENCOUNTER — Encounter: Payer: Self-pay | Admitting: Advanced Practice Midwife

## 2024-05-25 DIAGNOSIS — Z8041 Family history of malignant neoplasm of ovary: Secondary | ICD-10-CM | POA: Insufficient documentation

## 2024-05-25 DIAGNOSIS — Z8619 Personal history of other infectious and parasitic diseases: Secondary | ICD-10-CM | POA: Insufficient documentation

## 2024-05-27 ENCOUNTER — Telehealth: Payer: Self-pay

## 2024-05-27 NOTE — Telephone Encounter (Signed)
 RN called pt to inform her that the paperwork she submitted for completion titled   [place]  Department of Labor Notice of Eligibility & Rights and Responsibilities un the Family and Medical Leave Act is a form for her as an employee for her job to deem is eligible for FMLA, not a form for medical staff to fill out.  There is no section for healthcare provider to complete.  Pt became upset and stated her job's CEO printed the forms from internet and asked what paper do we need.  RN advised pt to reach out to HR or her leadership at work to inquire what form they need to provide her with, the office does not provide patient leave forms.  Pt asked if she could call back tomorrow, I told her yes.    [place], RN

## 2024-05-31 ENCOUNTER — Telehealth: Payer: Self-pay

## 2024-05-31 NOTE — Telephone Encounter (Signed)
 Called pt and reminded her about her appt tomorrow for Centering from 9-11.  Pt verbalized understanding and did not have any other questions.  Curry Dulski,RN  05/31/24

## 2024-06-01 ENCOUNTER — Ambulatory Visit (INDEPENDENT_AMBULATORY_CARE_PROVIDER_SITE_OTHER): Admitting: Advanced Practice Midwife

## 2024-06-01 ENCOUNTER — Other Ambulatory Visit: Payer: Self-pay

## 2024-06-01 ENCOUNTER — Encounter: Payer: Self-pay | Admitting: Advanced Practice Midwife

## 2024-06-01 VITALS — BP 100/66 | Wt 133.8 lb

## 2024-06-01 DIAGNOSIS — A6004 Herpesviral vulvovaginitis: Secondary | ICD-10-CM

## 2024-06-01 DIAGNOSIS — M549 Dorsalgia, unspecified: Secondary | ICD-10-CM | POA: Diagnosis not present

## 2024-06-01 DIAGNOSIS — Z3A19 19 weeks gestation of pregnancy: Secondary | ICD-10-CM | POA: Diagnosis not present

## 2024-06-01 DIAGNOSIS — O99891 Other specified diseases and conditions complicating pregnancy: Secondary | ICD-10-CM

## 2024-06-01 DIAGNOSIS — Z3493 Encounter for supervision of normal pregnancy, unspecified, third trimester: Secondary | ICD-10-CM

## 2024-06-01 NOTE — Patient Instructions (Addendum)
 MyChart assistance- [phone]

## 2024-06-01 NOTE — Progress Notes (Signed)
       PRENATAL VISIT NOTE- Centering Pregnancy Cycle 24, Session # 1  Subjective:  Pamela Sharp is a 28 y.o. G1P0000 at [redacted]w[redacted]d being seen today for ongoing prenatal care through Centering Pregnancy.  She is currently monitored for the following issues for this low-risk pregnancy and has Supervision of low-risk pregnancy and Genital herpes on their problem list.  Patient reports constipation, back/pelvic pain.  Contractions: Not present. Vag. Bleeding: None.  Movement: Present. Denies leaking of fluid/ROM.   The following portions of the patient's history were reviewed and updated as appropriate: allergies, current medications, past family history, past medical history, past social history, past surgical history and problem list. Problem list updated.  Objective:   Vitals:   06/01/24 1023  BP: 100/66  Weight: 133 lb 12.8 oz (60.7 kg)    Fetal Status: Fetal Heart Rate (bpm): 148   Movement: Present     General:  Alert, oriented and cooperative. Patient is in no acute distress.  Skin: Skin is warm and dry. No rash noted.   Cardiovascular: Normal heart rate noted  Respiratory: Normal respiratory effort, no problems with respiration noted  Abdomen: Soft, gravid, appropriate for gestational age.  Pain/Pressure: Absent     Pelvic: Cervical exam deferred        Extremities: Normal range of motion.  Edema: None  Mental Status: Normal mood and affect. Normal behavior. Normal judgment and thought content.   Assessment and Plan:  Pregnancy: G1P0000 at [redacted]w[redacted]d  1. Encounter for supervision of low-risk pregnancy in third trimester --Anticipatory guidance about next visits/weeks of pregnancy given.     Centering Pregnancy, Session#1: Introduction to model of care. Group determined rules for self-governance and closing phrase. Oriented group to space and mother's notebook.   Facilitated discussion today:  common discomforts, When to call practice  Mindfulness activity completed as  well as introduction to deep breathing for childbirth preparation- Centering 3 Breaths  Fundal height and FHR appropriate today unless noted otherwise in plan of care. Patient to continue group care.    2. Back pain affecting pregnancy in second trimester (Primary) --Rest/ice/heat/warm bath/increase PO fluids/Tylenol /pregnancy support belt  - AMB referral to rehabilitation  3. Herpes simplex vulvovaginitis --Valtrex at 34 weeks  4. [redacted] weeks gestation of pregnancy    Preterm labor symptoms and general obstetric precautions including but not limited to vaginal bleeding, contractions, leaking of fluid and fetal movement were reviewed in detail with the patient. Please refer to After Visit Summary for other counseling recommendations.  Return for Centering Sessions as scheduled.  Future Appointments  Date Time Provider Department Center  06/04/2024  2:00 PM [facility] PROVIDER 1 [facility]  06/04/2024  2:30 PM [facility] US2 [facility]  06/29/2024  9:00 AM CENTERING PROVIDER [facility]  07/27/2024  9:00 AM CENTERING PROVIDER [facility]  08/10/2024  9:00 AM CENTERING PROVIDER [facility]  08/24/2024  9:00 AM CENTERING PROVIDER [facility]  09/07/2024  9:00 AM CENTERING PROVIDER [facility]  09/21/2024  9:00 AM CENTERING PROVIDER [facility]  10/05/2024  9:00 AM CENTERING PROVIDER [facility]  10/12/2024  9:00 AM CENTERING PROVIDER [facility]  10/19/2024  9:00 AM CENTERING PROVIDER [facility]    Olam Boards, CNM

## 2024-06-04 ENCOUNTER — Ambulatory Visit: Attending: Maternal & Fetal Medicine | Admitting: Maternal & Fetal Medicine

## 2024-06-04 ENCOUNTER — Ambulatory Visit (HOSPITAL_BASED_OUTPATIENT_CLINIC_OR_DEPARTMENT_OTHER)

## 2024-06-04 VITALS — BP 117/70 | HR 77

## 2024-06-04 DIAGNOSIS — Z3482 Encounter for supervision of other normal pregnancy, second trimester: Secondary | ICD-10-CM | POA: Diagnosis not present

## 2024-06-04 DIAGNOSIS — Z3492 Encounter for supervision of normal pregnancy, unspecified, second trimester: Secondary | ICD-10-CM

## 2024-06-04 DIAGNOSIS — Z3A20 20 weeks gestation of pregnancy: Secondary | ICD-10-CM | POA: Diagnosis not present

## 2024-06-04 DIAGNOSIS — Z3490 Encounter for supervision of normal pregnancy, unspecified, unspecified trimester: Secondary | ICD-10-CM | POA: Diagnosis not present

## 2024-06-04 DIAGNOSIS — Z363 Encounter for antenatal screening for malformations: Secondary | ICD-10-CM | POA: Diagnosis present

## 2024-06-04 NOTE — Progress Notes (Signed)
 Patient information  Patient Name: Pamela Sharp  Patient MRN:   [id number]  Referring practice: MFM Referring Provider: [facility])  Problem List   Patient Active Problem List   Diagnosis Date Noted   Supervision of low-risk pregnancy 04/08/2024   Genital herpes 04/08/2024    Maternal Fetal Medicine Consult Pamela Sharp is a 28 y.o. G1P0000 at [redacted]w[redacted]d here for ultrasound and consultation. She had low risk aneuploidy screening of a female fetus. Carrier screening was Negative for the basic screening (SMA, alpha-thal, beta-thal, and cystic fibroisis. Maternal serum AFP was negative. She has no acute concerns.   She has been is doing well today and reports no obstetric concerns.  She denies any significant medical problems outside of pregnancy.  She was informed that her ultrasound appears normal.  She will follow-up with her OB provider.  Sonographic findings Single intrauterine pregnancy at 20w 2d. Fetal cardiac activity:  Observed and appears normal. Presentation: Transverse, head to maternal right. The anatomic structures that were well seen appear normal. The anatomic survey is complete.  Fetal biometry shows the estimated fetal weight at the 51 percentile. Amniotic fluid: Within normal limits.  MVP: 5.91 cm. Placenta: Posterior Fundal. Adnexa: No adnexal mass visualized. Cervical length: 3.35 cm.  There are limitations of prenatal ultrasound such as the inability to detect certain abnormalities due to poor visualization. Various factors such as fetal position, gestational age and maternal body habitus may increase the difficulty in visualizing the fetal anatomy.    Recommendations -EDD should be 10/20/2024 based on  LMP  (01/14/24). -No further ultrasounds are recommended at this time based on the current indications. If future indications arise (e.g. size/date discrepancy on fundal height, gestational diabetes or hypertension) and an ultrasound  is to be desired at our MFM office, please send a referral.   Review of Systems: A review of systems was performed and was negative except per HPI   Past Obstetrical History:  OB History  Gravida Para Term Preterm AB Living  1 0 0 0 0 0  SAB IAB Ectopic Multiple Live Births  0 0 0 0 0    # Outcome Date GA Lbr Len/2nd Weight Sex Type Anes PTL Lv  1 Current              Past Medical History:  Past Medical History:  Diagnosis Date   Asthma    Family history of malignant neoplasm of ovary 05/25/2024   Herpes genitalis in women    History of chlamydia infection 05/25/2024   12/29/2012     Seasonal allergies    Trichomoniasis 07/19/2016   07/19/2016       Past Surgical History:    Past Surgical History:  Procedure Laterality Date   NO PAST SURGERIES       Home Medications:   Current Outpatient Medications on File Prior to Visit  Medication Sig Dispense Refill   albuterol (VENTOLIN HFA) 108 (90 Base) MCG/ACT inhaler 1-2 puffs every 6 (six) hours as needed for shortness of breath.     ferrous sulfate 324 MG TBEC Take 324 mg by mouth daily.     Prenatal MV & Min w/FA-DHA (PRENATAL GUMMIES PO) Take 1 tablet by mouth daily at 6 (six) AM.     vitamin B-12 (CYANOCOBALAMIN) 100 MCG tablet Take 100 mcg by mouth daily.     Blood Pressure Monitoring (BLOOD PRESSURE KIT) DEVI 1 Device by Does not apply route as needed. 1  each 0   Misc. Devices (GOJJI WEIGHT SCALE) MISC 1 Device by Does not apply route once a week. 1 each 0   No current facility-administered medications on file prior to visit.      Allergies:   Allergies  Allergen Reactions   Antihistamines, Chlorpheniramine-Type Other (See Comments)    Reaction : unknown      Physical Exam:   Vitals:   06/04/24 1402  BP: 117/70  Pulse: 77   Sitting comfortably on the sonogram table Nonlabored breathing Normal rate and rhythm Abdomen is nontender  Thank you for the opportunity to be involved with this patient's care.  Please let us  know if we can be of any further assistance.   30 minutes of time was spent reviewing the patient's chart including labs, imaging and documentation.  At least 50% of this time was spent with direct patient care discussing the diagnosis, management and prognosis of her care.  [name] MFM, [place]   06/04/2024  3:11 PM

## 2024-06-17 ENCOUNTER — Encounter: Payer: Self-pay | Admitting: Advanced Practice Midwife

## 2024-06-22 NOTE — Progress Notes (Signed)
 Received notification from [facility] requesting dental letter to provide service for patient.  Dental letter faxed to [phone] and received successful transmission.   Taylore Hinde,RN  06/22/24

## 2024-06-29 ENCOUNTER — Ambulatory Visit: Payer: Self-pay | Admitting: Advanced Practice Midwife

## 2024-06-29 VITALS — BP 102/69 | HR 76 | Wt 133.4 lb

## 2024-06-29 DIAGNOSIS — Z3A23 23 weeks gestation of pregnancy: Secondary | ICD-10-CM

## 2024-06-29 DIAGNOSIS — Z3493 Encounter for supervision of normal pregnancy, unspecified, third trimester: Secondary | ICD-10-CM | POA: Diagnosis not present

## 2024-06-29 NOTE — Addendum Note (Signed)
 Addended by: ROSALYNN SHARLET RAMAN on: 06/29/2024 12:16 PM   Modules accepted: Orders

## 2024-06-29 NOTE — Progress Notes (Signed)
       PRENATAL VISIT NOTE- Centering Pregnancy Cycle 24 , Session # 2  Subjective:  Pamela Sharp is a 28 y.o. G1P0000 at [redacted]w[redacted]d being seen today for ongoing prenatal care through Centering Pregnancy.  She is currently monitored for the following issues for this low-risk pregnancy and has Supervision of low-risk pregnancy and Genital herpes on their problem list.  Patient reports no complaints.  Contractions: Not present. Vag. Bleeding: None.  Movement: Present. Denies leaking of fluid/ROM.   The following portions of the patient's history were reviewed and updated as appropriate: allergies, current medications, past family history, past medical history, past social history, past surgical history and problem list. Problem list updated.  Objective:   Vitals:   06/29/24 1102  BP: 102/69  Pulse: 76  Weight: 133 lb 6.4 oz (60.5 kg)    Fetal Status: Fetal Heart Rate (bpm): 146 Fundal Height: 21 cm Movement: Present     General:  Alert, oriented and cooperative. Patient is in no acute distress.  Skin: Skin is warm and dry. No rash noted.   Cardiovascular: Normal heart rate noted  Respiratory: Normal respiratory effort, no problems with respiration noted  Abdomen: Soft, gravid, appropriate for gestational age.  Pain/Pressure: Present (pressure)     Pelvic: Cervical exam deferred        Extremities: Normal range of motion.  Edema: None  Mental Status: Normal mood and affect. Normal behavior. Normal judgment and thought content.   Assessment and Plan:  Pregnancy: G1P0000 at [redacted]w[redacted]d 1. Encounter for supervision of low-risk pregnancy in third trimester (Primary) --Anticipatory guidance about next visits/weeks of pregnancy given.      Centering Pregnancy, Session#2: Reviewed rules for self-governance with group. Direct group to [employer].   Facilitated discussion today:  Nutrition, Pelvic Pain/Back pain, Nausea  Exercise to review how to give respect and to feel respected.    Activity-demonstrated exercises to alleviate pelvic/back pain and discussed management options including physical therapy.   Fundal height and FHR appropriate today unless noted otherwise in plan. Patient to continue group care.    2. [redacted] weeks gestation of pregnancy    Preterm labor symptoms and general obstetric precautions including but not limited to vaginal bleeding, contractions, leaking of fluid and fetal movement were reviewed in detail with the patient. Please refer to After Visit Summary for other counseling recommendations.  Return for Centering Sessions as scheduled.  Future Appointments  Date Time Provider Department Center  07/27/2024  9:00 AM CENTERING PROVIDER [facility]  08/10/2024  9:00 AM CENTERING PROVIDER [facility]  08/24/2024  9:00 AM CENTERING PROVIDER [facility]  09/07/2024  9:00 AM CENTERING PROVIDER [facility]  09/21/2024  9:00 AM CENTERING PROVIDER [facility]  10/05/2024  9:00 AM CENTERING PROVIDER [facility]  10/12/2024  9:00 AM CENTERING PROVIDER [facility]  10/19/2024  9:00 AM CENTERING PROVIDER [facility]    Olam Boards, CNM

## 2024-06-30 ENCOUNTER — Encounter: Payer: Self-pay | Admitting: Advanced Practice Midwife

## 2024-07-01 ENCOUNTER — Other Ambulatory Visit: Payer: Self-pay

## 2024-07-01 DIAGNOSIS — Z3493 Encounter for supervision of normal pregnancy, unspecified, third trimester: Secondary | ICD-10-CM

## 2024-07-27 ENCOUNTER — Encounter: Payer: Self-pay | Admitting: Advanced Practice Midwife

## 2024-07-27 ENCOUNTER — Ambulatory Visit (INDEPENDENT_AMBULATORY_CARE_PROVIDER_SITE_OTHER): Payer: Self-pay | Admitting: Advanced Practice Midwife

## 2024-07-27 ENCOUNTER — Other Ambulatory Visit: Payer: Self-pay

## 2024-07-27 ENCOUNTER — Other Ambulatory Visit

## 2024-07-27 VITALS — BP 100/67 | HR 71 | Wt 137.4 lb

## 2024-07-27 DIAGNOSIS — O26842 Uterine size-date discrepancy, second trimester: Secondary | ICD-10-CM

## 2024-07-27 DIAGNOSIS — Z2911 Encounter for prophylactic immunotherapy for respiratory syncytial virus (RSV): Secondary | ICD-10-CM | POA: Diagnosis not present

## 2024-07-27 DIAGNOSIS — Z3A27 27 weeks gestation of pregnancy: Secondary | ICD-10-CM

## 2024-07-27 DIAGNOSIS — Z3493 Encounter for supervision of normal pregnancy, unspecified, third trimester: Secondary | ICD-10-CM

## 2024-07-27 DIAGNOSIS — Z23 Encounter for immunization: Secondary | ICD-10-CM | POA: Diagnosis not present

## 2024-07-27 DIAGNOSIS — O26843 Uterine size-date discrepancy, third trimester: Secondary | ICD-10-CM

## 2024-07-27 NOTE — Progress Notes (Addendum)
° ° ° ° ° °  PRENATAL VISIT NOTE- Centering Pregnancy Cycle 24, Session # 3  Subjective:  Pamela Sharp is a 28 y.o. G1P0000 at [redacted]w[redacted]d being seen today for ongoing prenatal care through Centering Pregnancy.  She is currently monitored for the following issues for this low-risk pregnancy and has Supervision of low-risk pregnancy and Genital herpes on their problem list.  Patient reports no complaints.  Contractions: Not present. Vag. Bleeding: None.  Movement: Present. Denies leaking of fluid/ROM.   The following portions of the patient's history were reviewed and updated as appropriate: allergies, current medications, past family history, past medical history, past social history, past surgical history and problem list. Problem list updated.  Objective:   Vitals:   07/27/24 0904  BP: 100/67  Pulse: 71  Weight: 137 lb 6.4 oz (62.3 kg)    Fetal Status: Fetal Heart Rate (bpm): 142 Fundal Height: 24 cm Movement: Present     General:  Alert, oriented and cooperative. Patient is in no acute distress.  Skin: Skin is warm and dry. No rash noted.   Cardiovascular: Normal heart rate noted  Respiratory: Normal respiratory effort, no problems with respiration noted  Abdomen: Soft, gravid, appropriate for gestational age.  Pain/Pressure: Absent     Pelvic: Cervical exam deferred        Extremities: Normal range of motion.  Edema: None  Mental Status: Normal mood and affect. Normal behavior. Normal judgment and thought content.   Assessment and Plan:  Pregnancy: G1P0000 at [redacted]w[redacted]d 1. Uterine size-date discrepancy in third trimester (Primary) --FH 24 today, EFW at anatomy US  on 10/24 was 51%  - US  MFM OB FOLLOW UP; Future  2. Encounter for supervision of low-risk pregnancy in third trimester --Anticipatory guidance about next visits/weeks of pregnancy given.    Centering Pregnancy, Session#3: Reviewed resources in [employer].   Facilitated discussion today:  Stress/Stress reduction  and breastfeeding/infant nutrition Mindfulness activity completed with visualization, discussion facilitated about oral health, nutrition, and breastfeeding.  Fundal height and FHR appropriate today unless noted otherwise in plan. Patient to continue group care.   3. [redacted] weeks gestation of pregnancy   Preterm labor symptoms and general obstetric precautions including but not limited to vaginal bleeding, contractions, leaking of fluid and fetal movement were reviewed in detail with the patient. Please refer to After Visit Summary for other counseling recommendations.  Return for Centering Sessions as scheduled.  Future Appointments  Date Time Provider Department Center  08/03/2024  7:15 AM [facility] PROVIDER 1 [facility]  08/03/2024  7:30 AM [facility] US5 [facility]  08/10/2024  9:00 AM CENTERING PROVIDER [facility]  08/24/2024  9:00 AM CENTERING PROVIDER [facility]  09/07/2024  9:00 AM CENTERING PROVIDER [facility]  09/21/2024  9:00 AM CENTERING PROVIDER [facility]  10/05/2024  9:00 AM CENTERING PROVIDER [facility]  10/12/2024  9:00 AM CENTERING PROVIDER [facility]  10/19/2024  9:00 AM CENTERING PROVIDER [facility]    Olam Boards, CNM

## 2024-07-28 LAB — GLUCOSE TOLERANCE, 2 HOURS W/ 1HR
Glucose, 1 hour: 110 mg/dL (ref 70–179)
Glucose, 2 hour: 98 mg/dL (ref 70–152)
Glucose, Fasting: 68 mg/dL — ABNORMAL LOW (ref 70–91)

## 2024-07-28 LAB — CBC
Hematocrit: 33.1 % — ABNORMAL LOW (ref 34.0–46.6)
Hemoglobin: 11.4 g/dL (ref 11.1–15.9)
MCH: 32.7 pg (ref 26.6–33.0)
MCHC: 34.4 g/dL (ref 31.5–35.7)
MCV: 95 fL (ref 79–97)
Platelets: 212 x10E3/uL (ref 150–450)
RBC: 3.49 x10E6/uL — ABNORMAL LOW (ref 3.77–5.28)
RDW: 12.7 % (ref 11.7–15.4)
WBC: 6.8 x10E3/uL (ref 3.4–10.8)

## 2024-07-28 LAB — SYPHILIS: RPR W/REFLEX TO RPR TITER AND TREPONEMAL ANTIBODIES, TRADITIONAL SCREENING AND DIAGNOSIS ALGORITHM: RPR Ser Ql: NONREACTIVE

## 2024-07-28 LAB — HIV ANTIBODY (ROUTINE TESTING W REFLEX): HIV Screen 4th Generation wRfx: NONREACTIVE

## 2024-08-01 ENCOUNTER — Ambulatory Visit: Payer: Self-pay | Admitting: Advanced Practice Midwife

## 2024-08-03 ENCOUNTER — Ambulatory Visit (HOSPITAL_BASED_OUTPATIENT_CLINIC_OR_DEPARTMENT_OTHER): Admitting: Obstetrics and Gynecology

## 2024-08-03 ENCOUNTER — Ambulatory Visit: Attending: Obstetrics and Gynecology

## 2024-08-03 ENCOUNTER — Other Ambulatory Visit: Payer: Self-pay | Admitting: Advanced Practice Midwife

## 2024-08-03 ENCOUNTER — Other Ambulatory Visit: Payer: Self-pay | Admitting: *Deleted

## 2024-08-03 ENCOUNTER — Encounter: Payer: Self-pay | Admitting: Obstetrics and Gynecology

## 2024-08-03 DIAGNOSIS — O26843 Uterine size-date discrepancy, third trimester: Secondary | ICD-10-CM

## 2024-08-03 DIAGNOSIS — Z368A Encounter for antenatal screening for other genetic defects: Secondary | ICD-10-CM | POA: Insufficient documentation

## 2024-08-03 DIAGNOSIS — O365930 Maternal care for other known or suspected poor fetal growth, third trimester, not applicable or unspecified: Secondary | ICD-10-CM

## 2024-08-03 DIAGNOSIS — A6004 Herpesviral vulvovaginitis: Secondary | ICD-10-CM | POA: Insufficient documentation

## 2024-08-03 DIAGNOSIS — Z3A28 28 weeks gestation of pregnancy: Secondary | ICD-10-CM

## 2024-08-03 DIAGNOSIS — Z362 Encounter for other antenatal screening follow-up: Secondary | ICD-10-CM | POA: Diagnosis not present

## 2024-08-03 DIAGNOSIS — Z3493 Encounter for supervision of normal pregnancy, unspecified, third trimester: Secondary | ICD-10-CM | POA: Diagnosis present

## 2024-08-03 DIAGNOSIS — O98513 Other viral diseases complicating pregnancy, third trimester: Secondary | ICD-10-CM | POA: Diagnosis not present

## 2024-08-03 DIAGNOSIS — B009 Herpesviral infection, unspecified: Secondary | ICD-10-CM | POA: Diagnosis not present

## 2024-08-03 NOTE — Progress Notes (Deleted)
 Maternal-Fetal Medicine Consultation  Name: Pamela Sharp  MRN: [id number]  GA: G1P0000 [redacted]w[redacted]d    Patient is here for fetal growth assessment because of uterine-size-date discrepancy detected at your office examination. She does not have gestational diabetes.  Blood pressure today at our office is 108/55 mmHg.  Ultrasound Normal amniotic fluid.  The estimated fetal weight and the abdominal circumference measurements are at the 7th percentiles.  Umbilical artery Doppler showed normal forward diastolic flow.  No obvious fetal structural defects are seen. Fetal growth restriction I explained the finding of fetal growth restriction that is difficult to differentiate from a constitutionally small for gestational age fetus. Most fetuses are small but not growth restricted.  I discussed the possible causes of fetal growth restriction including placental insufficiency (most common cause), chromosomal anomalies and fetal infections.  Patient did not give history of fever or rashes.  I explained that only amniocentesis will give a definitive result on the fetal karyotype and some genetic conditions (Microarray).  I explained amniocentesis procedure and possible complication of preterm delivery (1 and 500 procedures). Patient opted not to have amniocentesis. I discussed ultrasound protocol of monitoring fetal growth restriction. Timing of delivery: We will counsel her on the timing of delivery based on future fetal growth assessments.   Patient's pregravid BMI 18.7 and I reassured her that the fetus is more-likely to be constitutionally-small fetus.   Fetal growth restriction can precede the onset of gestational hypertension/preeclampia in some cases.    Recommendations -Fetal growth and UA Doppler in 3 weeks.    Consultation including face-to-face (more than 50%) counseling 25 minutes.

## 2024-08-03 NOTE — Progress Notes (Signed)
 Maternal-Fetal Medicine Consultation  Name: Pamela Sharp  MRN: [id number]  GA: G1P0000 [redacted]w[redacted]d    Patient is here for fetal growth assessment because of uterine-size-date discrepancy detected at your office examination. She does not have gestational diabetes.  Blood pressure today at our office is 108/55 mmHg.  Ultrasound Normal amniotic fluid.  The estimated fetal weight and the abdominal circumference measurements are at the 7th percentiles.  Umbilical artery Doppler showed normal forward diastolic flow.  No obvious fetal structural defects are seen. Fetal growth restriction I explained the finding of fetal growth restriction that is difficult to differentiate from a constitutionally small for gestational age fetus. Most fetuses are small but not growth restricted.  I discussed the possible causes of fetal growth restriction including placental insufficiency (most common cause), chromosomal anomalies and fetal infections.  Patient did not give history of fever or rashes.  I explained that only amniocentesis will give a definitive result on the fetal karyotype and some genetic conditions (Microarray).  I explained amniocentesis procedure and possible complication of preterm delivery (1 and 500 procedures). Patient opted not to have amniocentesis. I discussed ultrasound protocol of monitoring fetal growth restriction. Timing of delivery: We will counsel her on the timing of delivery based on future fetal growth assessments.   Patient's pregravid BMI 18.7 and I reassured her that the fetus is more-likely to be constitutionally-small fetus.   Fetal growth restriction can precede the onset of gestational hypertension/preeclampia in some cases.    Recommendations -Fetal growth and UA Doppler in 3 weeks.    Consultation including face-to-face (more than 50%) counseling 25 minutes.

## 2024-08-03 NOTE — Progress Notes (Deleted)
 Maternal-Fetal Medicine Consultation  Name: Pamela Sharp  MRN: [id number]  GA: G1P0000 [redacted]w[redacted]d     Recommendations {RSREC:[id number]}     Consultation including face-to-face (more than 50%) counseling *** minutes.

## 2024-08-04 ENCOUNTER — Encounter: Payer: Self-pay | Admitting: Advanced Practice Midwife

## 2024-08-04 DIAGNOSIS — O365990 Maternal care for other known or suspected poor fetal growth, unspecified trimester, not applicable or unspecified: Secondary | ICD-10-CM | POA: Insufficient documentation

## 2024-08-17 ENCOUNTER — Telehealth: Payer: Self-pay

## 2024-08-17 NOTE — Telephone Encounter (Signed)
 Called pt and reached out in regards to missed Centering Pregnancy appt on 08/10/24.  Pt stated that she needed to go into work to make some money due to the holidays but wants to continue Centering and that she will see us  next week.  Pt denies any concerns.  Pt advised to reach out when if she questions/concerns.  Pt verbalized understanding with no further questions.   Osiris Odriscoll,RN  08/17/24

## 2024-08-30 ENCOUNTER — Ambulatory Visit: Attending: Maternal & Fetal Medicine | Admitting: Maternal & Fetal Medicine

## 2024-08-30 ENCOUNTER — Ambulatory Visit

## 2024-08-30 DIAGNOSIS — O365930 Maternal care for other known or suspected poor fetal growth, third trimester, not applicable or unspecified: Secondary | ICD-10-CM | POA: Diagnosis present

## 2024-08-30 DIAGNOSIS — O0993 Supervision of high risk pregnancy, unspecified, third trimester: Secondary | ICD-10-CM

## 2024-08-30 DIAGNOSIS — Z3A32 32 weeks gestation of pregnancy: Secondary | ICD-10-CM | POA: Diagnosis not present

## 2024-08-30 DIAGNOSIS — O26843 Uterine size-date discrepancy, third trimester: Secondary | ICD-10-CM

## 2024-08-30 DIAGNOSIS — O98513 Other viral diseases complicating pregnancy, third trimester: Secondary | ICD-10-CM

## 2024-08-30 DIAGNOSIS — B009 Herpesviral infection, unspecified: Secondary | ICD-10-CM | POA: Diagnosis not present

## 2024-08-30 DIAGNOSIS — Z362 Encounter for other antenatal screening follow-up: Secondary | ICD-10-CM | POA: Insufficient documentation

## 2024-08-30 NOTE — Progress Notes (Signed)
 "  Patient information  Patient Name: Pamela Sharp  Patient MRN:   [id number]  Referring practice: MFM Referring Provider: [facility])  Problem List   Patient Active Problem List   Diagnosis Date Noted   Fetal growth restriction antepartum 08/04/2024   Supervision of low-risk pregnancy 04/08/2024   Genital herpes 04/08/2024   Maternal Fetal medicine Consult  Pamela Sharp is a 29 y.o. G1P0000 at [redacted]w[redacted]d here for ultrasound and consultation. Alyona D Cleverly is doing well today with no acute concerns. Today we focused on the following:   The patient presents today for ultrasound evaluation in the setting of known fetal growth restriction. Umbilical artery Doppler studies and the biophysical profile are reassuring and within normal limits. Despite reassuring fetal testing, the fetus remains growth restricted, with an estimated fetal weight at the 5th percentile overall and an abdominal circumference at the 3rd percentile.  We discussed that these findings are consistent with moderate fetal growth restriction. If this growth pattern remains stable with reassuring antenatal testing and Doppler studies, delivery is typically recommended around [redacted] weeks gestation. However, if the estimated fetal weight declines below the 3rd percentile, or if antenatal testing or Doppler studies become abnormal, delivery may be recommended earlier, potentially as early as 37 weeks, depending on the clinical course.  We reviewed the importance of close surveillance, including weekly umbilical artery Doppler studies and biophysical profiles, as well as a repeat growth ultrasound in three weeks. The patient was counseled extensively on daily fetal movement monitoring and strict return precautions. We discussed the association between fetal growth restriction and increased risk of stillbirth, as well as the role of antenatal testing in mitigating this risk while balancing the risks of  prematurity when determining delivery timing. The patient verbalizes understanding and agrees with the plan.  Sonographic findings Single intrauterine pregnancy at 32w 5d. Fetal cardiac activity:  Observed and appears normal. Presentation: Cephalic. Interval fetal anatomy appears normal. Fetal biometry shows the estimated fetal weight of 3 lb 9 oz, 1628 grams (3.8%). Amniotic fluid: Within normal limits. AFI: 12.6cm.   MVP: 3.77 cm. Placenta: Posterior [id number]. Umbilical artery dopplers findings: -S/D:2.89 which are at this gestational age.  -Absent end-diastolic flow: No.  -Reversed end-diastolic flow:  No. BPP 8/8.   RECOMMENDATIONS -Continue weekly umbilical artery Doppler studies and biophysical profiles -Repeat growth ultrasound in three weeks -Daily fetal movement monitoring with strict return precautions -Anticipate delivery around 38 weeks if growth restriction remains moderate and testing remains reassuring. Consider delivery as early as 37 weeks if estimated fetal weight falls below the 3rd percentile or if Dopplers or antenatal testing become abnormal  The patient had time to ask questions that were answered to her satisfaction.  She verbalized understanding and agrees to proceed with the plan above.   I spent 30 minutes reviewing the patients chart, including labs and images as well as counseling the patient about her medical conditions. Greater than 50% of the time was spent in direct face-to-face patient counseling.  Pamela Sharp  MFM, [place]   08/30/2024  1:27 PM   Review of Systems: A review of systems was performed and was negative except per HPI   Vitals and Physical Exam    08/30/2024   11:18 AM 07/27/2024    9:04 AM 06/29/2024   11:02 AM  Vitals with BMI  Weight  137 lbs 6 oz 133 lbs 6 oz  Systolic 127 100 897  Diastolic 64 67  69  Pulse 79 71 76    Sitting comfortably on the sonogram table Nonlabored breathing Normal rate and rhythm Abdomen is  nontender  Past pregnancies OB History  Gravida Para Term Preterm AB Living  1 0 0 0 0 0  SAB IAB Ectopic Multiple Live Births  0 0 0 0 0    # Outcome Date GA Lbr Len/2nd Weight Sex Type Anes PTL Lv  1 Current              Future Appointments  Date Time Provider Department Center  09/07/2024  9:00 AM CENTERING PROVIDER [facility]  09/09/2024  3:15 PM [facility] PROVIDER 1 [facility]  09/09/2024  3:30 PM [facility] US4 [facility]  09/16/2024  3:15 PM [facility] PROVIDER 1 [facility]  09/16/2024  3:30 PM [facility] US1 [facility]  09/21/2024  9:00 AM CENTERING PROVIDER [facility]  09/23/2024  3:15 PM [facility] PROVIDER 1 [facility]  09/23/2024  3:30 PM [facility] US3 [facility]  10/05/2024  9:00 AM CENTERING PROVIDER [facility]  10/12/2024  9:00 AM CENTERING PROVIDER [facility]  10/19/2024  9:00 AM CENTERING PROVIDER [facility]      "

## 2024-09-06 ENCOUNTER — Telehealth: Payer: Self-pay | Admitting: Advanced Practice Midwife

## 2024-09-06 NOTE — Telephone Encounter (Signed)
 Tried calling patient about appointment change for tomorrow, We are converting appointment over from in person to Virtual/mychart due to office will be on a delay due to the weather also a mychart message has been sent.   Pamela Sharp

## 2024-09-07 ENCOUNTER — Encounter: Payer: Self-pay | Admitting: Advanced Practice Midwife

## 2024-09-07 ENCOUNTER — Telehealth: Payer: Self-pay

## 2024-09-07 VITALS — BP 110/76 | HR 92 | Wt 140.2 lb

## 2024-09-07 DIAGNOSIS — M7918 Myalgia, other site: Secondary | ICD-10-CM

## 2024-09-07 DIAGNOSIS — O365990 Maternal care for other known or suspected poor fetal growth, unspecified trimester, not applicable or unspecified: Secondary | ICD-10-CM

## 2024-09-07 DIAGNOSIS — O0993 Supervision of high risk pregnancy, unspecified, third trimester: Secondary | ICD-10-CM

## 2024-09-07 DIAGNOSIS — O479 False labor, unspecified: Secondary | ICD-10-CM

## 2024-09-07 DIAGNOSIS — W009XXA Unspecified fall due to ice and snow, initial encounter: Secondary | ICD-10-CM

## 2024-09-07 DIAGNOSIS — Z3A33 33 weeks gestation of pregnancy: Secondary | ICD-10-CM

## 2024-09-07 MED ORDER — CYCLOBENZAPRINE HCL 10 MG PO TABS: 5.0000 mg | ORAL_TABLET | Freq: Three times a day (TID) | ORAL | 1 refills | Status: AC | PRN

## 2024-09-07 NOTE — Progress Notes (Signed)
 "  OBSTETRICS PRENATAL VIRTUAL VISIT ENCOUNTER NOTE  Provider location: [facility]   Patient location: Home  I connected with Ieisha D Hollars on 09/07/24 at  9:00 AM EST by MyChart Video Encounter and verified that I am speaking with the correct person using two identifiers. I discussed the limitations, risks, security and privacy concerns of performing an evaluation and management service virtually and the availability of in person appointments. I also discussed with the patient that there may be a patient responsible charge related to this service. The patient expressed understanding and agreed to proceed. Subjective:  Blanche D Tostenson is a 29 y.o. G1P0000 at [redacted]w[redacted]d being seen today for ongoing prenatal care.  She is currently monitored for the following issues for this high-risk pregnancy and has Supervision of high risk pregnancy in third trimester; Genital herpes; and Fetal growth restriction antepartum on their problem list.  Patient reports a fall on the ice yesterday with groin pain today, intermittent irregular cramping before the fall.  Contractions: Irritability. Vag. Bleeding: None.  Movement: Present. Denies any leaking of fluid.   The following portions of the patient's history were reviewed and updated as appropriate: allergies, current medications, past family history, past medical history, past social history, past surgical history and problem list.   Objective:    Vitals:   09/07/24 1005  BP: 110/76  Pulse: 92  Weight: 140 lb 3.2 oz (63.6 kg)    Fetal Status:      Movement: Present    General: Alert, oriented and cooperative. Patient is in no acute distress.  Respiratory: Normal respiratory effort, no problems with respiration noted  Mental Status: Normal mood and affect. Normal behavior. Normal judgment and thought content.  Rest of physical exam deferred due to type of encounter  Imaging: US  MFM OB FOLLOW UP Result Date:  08/30/2024 ----------------------------------------------------------------------  OBSTETRICS REPORT                       (Signed Final 08/30/2024 01:29 pm) ---------------------------------------------------------------------- Patient Info  ID #:       [id number]                          D.O.B.:  [birth year] (29 yrs)(F)  Name:       Aurea D Asselin                Visit Date: 08/30/2024 07:33 am ---------------------------------------------------------------------- Performed By  Attending:        [name] DO       Ref. Address:     [address]                                                             [place], [place]                                                             [address]  Performed By:     Jonette Nap        [facility]                                  [facility]  Referred By:      LYNWOOD KANDICE SOLOMONS                    MD ---------------------------------------------------------------------- Orders  #  Description                           Code        Ordered By  1  US  MFM OB FOLLOW UP                   76816.01    RAVI SHANKAR  2  US  MFM FETAL BPP WO NON               76819.01    RAVI SHANKAR     STRESS  3  US  MFM UA CORD DOPPLER                76820.02    RAVI [facility] ----------------------------------------------------------------------  #  Order #                     Accession #                Episode #  1  [id number]                   [id number]                 [id number]  2  [id number]                   [id number]                 [id number]  3  [id number]                   [id number]                 [id number] ---------------------------------------------------------------------- Indications  Maternal care for known or suspected poor      O36.5930  fetal growth, third trimester, single fetus  IUGR  Uterine  size-date discrepancy, third trimester O26.843  Herpes simplex virus (HSV)                     O98.519 B00.9  LR NIPS, Neg AFP/Horizon - 2HR GTT WNL  Encounter for other antenatal screening        Z36.2  follow-up  [redacted] weeks gestation of pregnancy                Z3A.32  Pregravid BMI 18.7 ---------------------------------------------------------------------- Fetal Evaluation  Num Of Fetuses:         1  Fetal Heart Rate(bpm):  140  Cardiac Activity:       Observed  Presentation:           Cephalic  Placenta:               Posterior Fundal16  P. Cord Insertion:      Previously seen  Amniotic Fluid  AFI FV:      Within normal limits  AFI Sum(cm)     %Tile       Largest Pocket(cm)  12.6            37          3.77  RUQ(cm)       RLQ(cm)       LUQ(cm)        LLQ(cm)  3.77          2.99          2.85           2.99 ---------------------------------------------------------------------- Biophysical Evaluation  Amniotic F.V:   Pocket => 2 cm             F. Tone:        Observed  F. Movement:    Observed                   Score:          8/8  F. Breathing:   Observed ---------------------------------------------------------------------- Biometry  BPD:      79.3  mm     G. Age:  31w 6d         19  %    CI:        74.15   %    70 - 86                                                          FL/HC:      20.0   %    19.9 - 21.5  HC:      292.4  mm     G. Age:  32w 2d          8  %    HC/AC:      1.12        0.96 - 1.11  AC:      262.1  mm     G. Age:  30w 2d        3.2  %    FL/BPD:     73.9   %    71 - 87  FL:       58.6  mm     G. Age:  30w 4d        3.1  %    FL/AC:      22.4   %    20 - 24  LV:          2  mm  Est. FW:    1628  gm      3 lb 9 oz    3.8  %  Est. FW at 39 Wks:       2660  gm    5 lb 14 oz ---------------------------------------------------------------------- OB History  Gravidity:    1  Living:       0 ---------------------------------------------------------------------- Gestational Age  LMP:           32w 5d         Date:  01/14/24                   EDD:   10/20/24  U/S Today:     31w 2d                                        EDD:   10/30/24  Best:          32w 5d     Det. By:  LMP  (01/14/24)          EDD:   10/20/24 ---------------------------------------------------------------------- Anatomy  Cranium:               Previously seen        Aortic Arch:            Previously seen  Cavum:                 Previously seen        Ductal Arch:            Previously seen  Ventricles:            Appears normal         Diaphragm:              Appears normal  Choroid Plexus:        Previously seen        Stomach:                Appears normal, left                                                                        sided  Cerebellum:            Previously seen        Abdomen:                Previously seen  Posterior Fossa:       Previously seen        Abdominal Wall:         Previously seen  Face:                  Orbits and profile     Cord Vessels:           Previously seen                         previously seen  Lips:                  Previously seen        Kidneys:                Appear normal  Thoracic:  Previously seen        Bladder:                Appears normal  Heart:                 Appears normal         Spine:                  Previously seen                         (4CH, axis, and                         situs)  RVOT:                  Previously seen        Upper Extremities:      Previously seen  LVOT:                  Previously seen        Lower Extremities:      Previously seen  Other:  Fetal anatomic survey complete on prior scans. ---------------------------------------------------------------------- Doppler - Fetal Vessels  Umbilical Artery   S/D     %tile      RI    %tile      PI    %tile     PSV    ADFV    RDFV                                                     (cm/s)   2.89       66    0.65       70    1.01       74    48.14      No      No  ---------------------------------------------------------------------- Comments  Sonographic findings  Single intrauterine pregnancy at 32w 5d.  Fetal cardiac activity:  Observed and appears normal.  Presentation: Cephalic.  Interval fetal anatomy appears normal.  Fetal biometry shows the estimated fetal weight of 3 lb 9 oz,  1628 grams (3.8%).  Amniotic fluid: Within normal limits. AFI: 12.6cm.   MVP: 3.77  cm.  Placenta: Posterior [id number].  Umbilical artery dopplers findings:  -S/D:2.89 which are at this gestational age.  -Absent end-diastolic flow: No.  -Reversed end-diastolic flow:  No.  BPP 8/8.  Recommendations  - See Epic note for assessment and plan of care. Any  referring office that does not utilize Epic will recieve a copy of  today's consult note via fax. Please contact our office with  any concerns.  There are limitations of prenatal ultrasound such as the  inability to detect certain abnormalities due to poor  visualization. Various factors such as fetal position,  gestational age and maternal body habitus may increase the  difficulty in visualizing the fetal anatomy. ----------------------------------------------------------------------                  [name], DO Electronically Signed Final Report   08/30/2024 01:29 pm ----------------------------------------------------------------------   US  MFM FETAL BPP WO NON STRESS Result Date: 08/30/2024 ----------------------------------------------------------------------  OBSTETRICS REPORT                       (  Signed Final 08/30/2024 01:29 pm) ---------------------------------------------------------------------- Patient Info  ID #:       [id number]                          D.O.B.:  [birth year] (28 yrs)(F)  Name:       Mallori D Propes                Visit Date: 08/30/2024 07:33 am ---------------------------------------------------------------------- Performed By  Attending:        [name] DO       Ref. Address:     [address]                                                              [place], [place]                                                             [address]  Performed By:     Jonette Nap        [facility]                                  [facility]  Referred By:      LYNWOOD KANDICE SOLOMONS                    MD ---------------------------------------------------------------------- Orders  #  Description                           Code        Ordered By  1  US  MFM OB FOLLOW UP                   76816.01    RAVI SHANKAR  2  US  MFM FETAL BPP WO NON               23180.98    RAVI SHANKAR     STRESS  3  US  MFM UA CORD DOPPLER  23179.97    RAVI SHANKAR ----------------------------------------------------------------------  #  Order #                     Accession #                Episode #  1  [id number]                   [id number]                 [id number]  2  [id number]                   [id number]                 [id number]  3  [id number]                   [id number]                 [id number] ---------------------------------------------------------------------- Indications  Maternal care for known or suspected poor      O36.5930  fetal growth, third trimester, single fetus  IUGR  Uterine size-date discrepancy, third trimester O26.843  Herpes simplex virus (HSV)                     O98.519 B00.9  LR NIPS, Neg AFP/Horizon - 2HR GTT WNL  Encounter for other antenatal screening        Z36.2  follow-up  [redacted] weeks gestation of pregnancy                Z3A.32  Pregravid BMI 18.7 ---------------------------------------------------------------------- Fetal Evaluation  Num Of Fetuses:         1  Fetal Heart Rate(bpm):  140  Cardiac Activity:       Observed  Presentation:           Cephalic  Placenta:               Posterior Fundal16  P. Cord Insertion:       Previously seen  Amniotic Fluid  AFI FV:      Within normal limits  AFI Sum(cm)     %Tile       Largest Pocket(cm)  12.6            37          3.77  RUQ(cm)       RLQ(cm)       LUQ(cm)        LLQ(cm)  3.77          2.99          2.85           2.99 ---------------------------------------------------------------------- Biophysical Evaluation  Amniotic F.V:   Pocket => 2 cm             F. Tone:        Observed  F. Movement:    Observed                   Score:          8/8  F. Breathing:   Observed ---------------------------------------------------------------------- Biometry  BPD:      79.3  mm     G. Age:  31w 6d         19  %    CI:        74.15   %    70 - 86  FL/HC:      20.0   %    19.9 - 21.5  HC:      292.4  mm     G. Age:  32w 2d          8  %    HC/AC:      1.12        0.96 - 1.11  AC:      262.1  mm     G. Age:  30w 2d        3.2  %    FL/BPD:     73.9   %    71 - 87  FL:       58.6  mm     G. Age:  30w 4d        3.1  %    FL/AC:      22.4   %    20 - 24  LV:          2  mm  Est. FW:    1628  gm      3 lb 9 oz    3.8  %  Est. FW at 39 Wks:       2660  gm    5 lb 14 oz ---------------------------------------------------------------------- OB History  Gravidity:    1  Living:       0 ---------------------------------------------------------------------- Gestational Age  LMP:           32w 5d        Date:  01/14/24                   EDD:   10/20/24  U/S Today:     31w 2d                                        EDD:   10/30/24  Best:          32w 5d     Det. By:  LMP  (01/14/24)          EDD:   10/20/24 ---------------------------------------------------------------------- Anatomy  Cranium:               Previously seen        Aortic Arch:            Previously seen  Cavum:                 Previously seen        Ductal Arch:            Previously seen  Ventricles:            Appears normal         Diaphragm:              Appears normal  Choroid Plexus:         Previously seen        Stomach:                Appears normal, left  sided  Cerebellum:            Previously seen        Abdomen:                Previously seen  Posterior Fossa:       Previously seen        Abdominal Wall:         Previously seen  Face:                  Orbits and profile     Cord Vessels:           Previously seen                         previously seen  Lips:                  Previously seen        Kidneys:                Appear normal  Thoracic:              Previously seen        Bladder:                Appears normal  Heart:                 Appears normal         Spine:                  Previously seen                         (4CH, axis, and                         situs)  RVOT:                  Previously seen        Upper Extremities:      Previously seen  LVOT:                  Previously seen        Lower Extremities:      Previously seen  Other:  Fetal anatomic survey complete on prior scans. ---------------------------------------------------------------------- Doppler - Fetal Vessels  Umbilical Artery   S/D     %tile      RI    %tile      PI    %tile     PSV    ADFV    RDFV                                                     (cm/s)   2.89       66    0.65       70    1.01       74    48.14      No      No ---------------------------------------------------------------------- Comments  Sonographic findings  Single intrauterine pregnancy at 32w 5d.  Fetal cardiac activity:  Observed and appears normal.  Presentation: Cephalic.  Interval fetal anatomy appears normal.  Fetal biometry shows the estimated fetal weight of 3 lb  9 oz,  1628 grams (3.8%).  Amniotic fluid: Within normal limits. AFI: 12.6cm.   MVP: 3.77  cm.  Placenta: Posterior [id number].  Umbilical artery dopplers findings:  -S/D:2.89 which are at this gestational age.  -Absent end-diastolic flow: No.  -Reversed end-diastolic flow:  No.  BPP 8/8.   Recommendations  - See Epic note for assessment and plan of care. Any  referring office that does not utilize Epic will recieve a copy of  today's consult note via fax. Please contact our office with  any concerns.  There are limitations of prenatal ultrasound such as the  inability to detect certain abnormalities due to poor  visualization. Various factors such as fetal position,  gestational age and maternal body habitus may increase the  difficulty in visualizing the fetal anatomy. ----------------------------------------------------------------------                  [name], DO Electronically Signed Final Report   08/30/2024 01:29 pm ----------------------------------------------------------------------   US  MFM UA CORD DOPPLER Result Date: 08/30/2024 ----------------------------------------------------------------------  OBSTETRICS REPORT                       (Signed Final 08/30/2024 01:29 pm) ---------------------------------------------------------------------- Patient Info  ID #:       [id number]                          D.O.B.:  10-26-1995 (28 yrs)(F)  Name:       Verenise D Ponciano                Visit Date: 08/30/2024 07:33 am ---------------------------------------------------------------------- Performed By  Attending:        [name] DO       Ref. Address:     [address]                                                             [place], [place]                                                             [address]  Performed By:     Jonette Nap        [facility]                                  [facility]                                                             [facility]  Referred By:      LYNWOOD KANDICE SOLOMONS                    MD ---------------------------------------------------------------------- Orders  #  Description                           Code        Ordered  By  1  US  MFM OB FOLLOW UP                   76816.01    RAVI SHANKAR  2  US  MFM FETAL BPP WO NON               76819.01    RAVI SHANKAR     STRESS  3  US  MFM UA CORD DOPPLER                76820.02    RAVI SHANKAR ----------------------------------------------------------------------  #  Order #                     Accession #                Episode #  1  [id number]                   [id number]                 [id number]  2  [id number]                   [id number]                 [id number]  3  [id number]                   [id number]                 [id number] ---------------------------------------------------------------------- Indications  Maternal care for known or suspected poor      O36.5930  fetal growth, third trimester, single fetus  IUGR  Uterine size-date discrepancy, third trimester O26.843  Herpes simplex virus (HSV)                     O98.519 B00.9  LR NIPS, Neg AFP/Horizon - 2HR GTT WNL  Encounter for other antenatal screening        Z36.2  follow-up  [redacted] weeks gestation of pregnancy                Z3A.32  Pregravid BMI 18.7 ---------------------------------------------------------------------- Fetal Evaluation  Num Of Fetuses:         1  Fetal Heart Rate(bpm):  140  Cardiac Activity:       Observed  Presentation:           Cephalic  Placenta:               Posterior Fundal16  P. Cord Insertion:      Previously seen  Amniotic Fluid  AFI FV:      Within normal limits  AFI Sum(cm)     %Tile       Largest Pocket(cm)  12.6            37          3.77  RUQ(cm)       RLQ(cm)       LUQ(cm)        LLQ(cm)  3.77  2.99          2.85           2.99 ---------------------------------------------------------------------- Biophysical Evaluation  Amniotic F.V:   Pocket => 2 cm             F. Tone:        Observed  F. Movement:    Observed                   Score:          8/8  F. Breathing:   Observed ---------------------------------------------------------------------- Biometry  BPD:      79.3  mm     G. Age:   31w 6d         19  %    CI:        74.15   %    70 - 86                                                          FL/HC:      20.0   %    19.9 - 21.5  HC:      292.4  mm     G. Age:  32w 2d          8  %    HC/AC:      1.12        0.96 - 1.11  AC:      262.1  mm     G. Age:  30w 2d        3.2  %    FL/BPD:     73.9   %    71 - 87  FL:       58.6  mm     G. Age:  30w 4d        3.1  %    FL/AC:      22.4   %    20 - 24  LV:          2  mm  Est. FW:    1628  gm      3 lb 9 oz    3.8  %  Est. FW at 39 Wks:       2660  gm    5 lb 14 oz ---------------------------------------------------------------------- OB History  Gravidity:    1  Living:       0 ---------------------------------------------------------------------- Gestational Age  LMP:           32w 5d        Date:  01/14/24                   EDD:   10/20/24  U/S Today:     31w 2d                                        EDD:   10/30/24  Best:          32w 5d     Det. By:  LMP  (01/14/24)          EDD:   10/20/24 ---------------------------------------------------------------------- Anatomy  Cranium:  Previously seen        Aortic Arch:            Previously seen  Cavum:                 Previously seen        Ductal Arch:            Previously seen  Ventricles:            Appears normal         Diaphragm:              Appears normal  Choroid Plexus:        Previously seen        Stomach:                Appears normal, left                                                                        sided  Cerebellum:            Previously seen        Abdomen:                Previously seen  Posterior Fossa:       Previously seen        Abdominal Wall:         Previously seen  Face:                  Orbits and profile     Cord Vessels:           Previously seen                         previously seen  Lips:                  Previously seen        Kidneys:                Appear normal  Thoracic:              Previously seen        Bladder:                Appears normal   Heart:                 Appears normal         Spine:                  Previously seen                         (4CH, axis, and                         situs)  RVOT:                  Previously seen        Upper Extremities:      Previously seen  LVOT:                  Previously seen  Lower Extremities:      Previously seen  Other:  Fetal anatomic survey complete on prior scans. ---------------------------------------------------------------------- Doppler - Fetal Vessels  Umbilical Artery   S/D     %tile      RI    %tile      PI    %tile     PSV    ADFV    RDFV                                                     (cm/s)   2.89       66    0.65       70    1.01       74    48.14      No      No ---------------------------------------------------------------------- Comments  Sonographic findings  Single intrauterine pregnancy at 32w 5d.  Fetal cardiac activity:  Observed and appears normal.  Presentation: Cephalic.  Interval fetal anatomy appears normal.  Fetal biometry shows the estimated fetal weight of 3 lb 9 oz,  1628 grams (3.8%).  Amniotic fluid: Within normal limits. AFI: 12.6cm.   MVP: 3.77  cm.  Placenta: Posterior [id number].  Umbilical artery dopplers findings:  -S/D:2.89 which are at this gestational age.  -Absent end-diastolic flow: No.  -Reversed end-diastolic flow:  No.  BPP 8/8.  Recommendations  - See Epic note for assessment and plan of care. Any  referring office that does not utilize Epic will recieve a copy of  today's consult note via fax. Please contact our office with  any concerns.  There are limitations of prenatal ultrasound such as the  inability to detect certain abnormalities due to poor  visualization. Various factors such as fetal position,  gestational age and maternal body habitus may increase the  difficulty in visualizing the fetal anatomy. ----------------------------------------------------------------------                  [name], DO Electronically Signed Final Report    08/30/2024 01:29 pm ----------------------------------------------------------------------    Assessment and Plan:  Pregnancy: G1P0000 at [redacted]w[redacted]d 1. Supervision of high risk pregnancy in third trimester (Primary) --Pt reports good fetal movement, denies cramping, LOF, or vaginal bleeding  --Anticipatory guidance about next visits/weeks of pregnancy given.   2. Fetal growth restriction antepartum --Reviewed pt US  results from 08/30/24.  EFW went from 5% on prior US  to 3.8%.   --Discussed dx of FGR and associated antenatal testing and recommended IOL around 39 weeks per MFM.   --Pt states understanding.  Questions answered for pt.  Pt eating plenty of protein-rich calories. Baby is moving well.   --IOL on Feb 25 at 38 weeks   3. [redacted] weeks gestation of pregnancy   4. Fall due to slipping on ice or snow, initial encounter --Pt did not hit abdomen and hurt her groin while catching herself in the fall.   --Pain improved with heat overnight --Offered Flexeril  for PRN use and pt accepted  - cyclobenzaprine  (FLEXERIL ) 10 MG tablet; Take 0.5-1 tablets (5-10 mg total) by mouth 3 (three) times daily as needed for muscle spasms.  Dispense: 15 tablet; Refill: 1  5. Musculoskeletal pain  - cyclobenzaprine  (FLEXERIL ) 10 MG tablet; Take 0.5-1 tablets (5-10 mg total) by mouth 3 (three) times daily as needed for muscle spasms.  Dispense: 15 tablet; Refill: 1  6. Braxton Hicks contractions --Rest/ice/heat/warm bath/increase PO fluids/Tylenol /pregnancy support belt    Preterm labor symptoms and general obstetric precautions including but not limited to vaginal bleeding, contractions, leaking of fluid and fetal movement were reviewed in detail with the patient. I discussed the assessment and treatment plan with the patient. The patient was provided an opportunity to ask questions and all were answered. The patient agreed with the plan and demonstrated an understanding of the instructions. The patient was  advised to call back or seek an in-person office evaluation/go to [facility] at [facility] for any urgent or concerning symptoms. Please refer to After Visit Summary for other counseling recommendations.   I provided 20 minutes of face-to-face time during this encounter.  Return for Centering Sessions as scheduled.  Future Appointments  Date Time Provider Department Center  09/09/2024  3:15 PM [facility] PROVIDER 1 [facility]  09/09/2024  3:30 PM [facility] US4 [facility]  09/16/2024  3:15 PM [facility] PROVIDER 1 [facility]  09/16/2024  3:30 PM [facility] US1 [facility]  09/21/2024  9:00 AM CENTERING PROVIDER [facility]  09/23/2024  3:15 PM [facility] PROVIDER 1 [facility]  09/23/2024  3:30 PM [facility] US3 [facility]  10/05/2024  9:00 AM CENTERING PROVIDER [facility]  10/12/2024  9:00 AM CENTERING PROVIDER [facility]  10/19/2024  9:00 AM CENTERING PROVIDER [facility]    Olam Boards, CNM [facility] [employer]  "

## 2024-09-09 ENCOUNTER — Ambulatory Visit: Attending: Maternal & Fetal Medicine

## 2024-09-09 ENCOUNTER — Ambulatory Visit (HOSPITAL_BASED_OUTPATIENT_CLINIC_OR_DEPARTMENT_OTHER): Admitting: Obstetrics and Gynecology

## 2024-09-09 VITALS — BP 112/65 | HR 78

## 2024-09-09 DIAGNOSIS — O365930 Maternal care for other known or suspected poor fetal growth, third trimester, not applicable or unspecified: Secondary | ICD-10-CM | POA: Diagnosis present

## 2024-09-09 DIAGNOSIS — O26843 Uterine size-date discrepancy, third trimester: Secondary | ICD-10-CM | POA: Diagnosis not present

## 2024-09-09 DIAGNOSIS — B009 Herpesviral infection, unspecified: Secondary | ICD-10-CM

## 2024-09-09 DIAGNOSIS — Z3A34 34 weeks gestation of pregnancy: Secondary | ICD-10-CM | POA: Insufficient documentation

## 2024-09-09 DIAGNOSIS — O365990 Maternal care for other known or suspected poor fetal growth, unspecified trimester, not applicable or unspecified: Secondary | ICD-10-CM | POA: Insufficient documentation

## 2024-09-09 DIAGNOSIS — O98513 Other viral diseases complicating pregnancy, third trimester: Secondary | ICD-10-CM | POA: Diagnosis not present

## 2024-09-09 NOTE — Progress Notes (Signed)
 After review, MFM consult with provider is not indicated for today  Arna Ranks, MD 09/09/2024 4:34 PM  [facility]

## 2024-09-16 ENCOUNTER — Ambulatory Visit

## 2024-09-16 ENCOUNTER — Ambulatory Visit (HOSPITAL_BASED_OUTPATIENT_CLINIC_OR_DEPARTMENT_OTHER): Admitting: Obstetrics and Gynecology

## 2024-09-16 VITALS — BP 115/68 | HR 79

## 2024-09-16 DIAGNOSIS — O365930 Maternal care for other known or suspected poor fetal growth, third trimester, not applicable or unspecified: Secondary | ICD-10-CM | POA: Diagnosis not present

## 2024-09-16 DIAGNOSIS — O98513 Other viral diseases complicating pregnancy, third trimester: Secondary | ICD-10-CM | POA: Diagnosis not present

## 2024-09-16 DIAGNOSIS — B009 Herpesviral infection, unspecified: Secondary | ICD-10-CM | POA: Diagnosis not present

## 2024-09-16 DIAGNOSIS — O26843 Uterine size-date discrepancy, third trimester: Secondary | ICD-10-CM | POA: Diagnosis not present

## 2024-09-16 DIAGNOSIS — O0993 Supervision of high risk pregnancy, unspecified, third trimester: Secondary | ICD-10-CM | POA: Diagnosis not present

## 2024-09-16 DIAGNOSIS — O365990 Maternal care for other known or suspected poor fetal growth, unspecified trimester, not applicable or unspecified: Secondary | ICD-10-CM | POA: Diagnosis not present

## 2024-09-16 DIAGNOSIS — Z3A35 35 weeks gestation of pregnancy: Secondary | ICD-10-CM | POA: Diagnosis not present

## 2024-09-16 DIAGNOSIS — A6004 Herpesviral vulvovaginitis: Secondary | ICD-10-CM

## 2024-09-16 NOTE — Progress Notes (Signed)
 After review, MFM consult with provider is not indicated for today  Arna Ranks, MD 09/16/2024 5:13 PM  [facility]

## 2024-09-23 ENCOUNTER — Ambulatory Visit

## 2024-09-23 ENCOUNTER — Other Ambulatory Visit

## 2024-10-20 ENCOUNTER — Inpatient Hospital Stay (HOSPITAL_COMMUNITY): Admit: 2024-10-20
